# Patient Record
Sex: Female | Born: 1955
Health system: Southern US, Community
[De-identification: ages and names within clinical notes are randomized; demographics above are authoritative.]

## PROBLEM LIST (undated history)

## (undated) DIAGNOSIS — Z86010 Personal history of colonic polyps: Secondary | ICD-10-CM

## (undated) DIAGNOSIS — Z800 Family history of malignant neoplasm of digestive organs: Secondary | ICD-10-CM

## (undated) DIAGNOSIS — E785 Hyperlipidemia, unspecified: Secondary | ICD-10-CM

## (undated) DIAGNOSIS — Z8542 Personal history of malignant neoplasm of other parts of uterus: Secondary | ICD-10-CM

## (undated) DIAGNOSIS — Z85528 Personal history of other malignant neoplasm of kidney: Secondary | ICD-10-CM

## (undated) DIAGNOSIS — Z8042 Family history of malignant neoplasm of prostate: Secondary | ICD-10-CM

## (undated) DIAGNOSIS — K219 Gastro-esophageal reflux disease without esophagitis: Secondary | ICD-10-CM

## (undated) DIAGNOSIS — I4891 Unspecified atrial fibrillation: Secondary | ICD-10-CM

## (undated) DIAGNOSIS — C801 Malignant (primary) neoplasm, unspecified: Secondary | ICD-10-CM

## (undated) DIAGNOSIS — I10 Essential (primary) hypertension: Secondary | ICD-10-CM

## (undated) DIAGNOSIS — Z905 Acquired absence of kidney: Secondary | ICD-10-CM

## (undated) DIAGNOSIS — G4730 Sleep apnea, unspecified: Secondary | ICD-10-CM

## (undated) DIAGNOSIS — C649 Malignant neoplasm of unspecified kidney, except renal pelvis: Secondary | ICD-10-CM

## (undated) DIAGNOSIS — Z808 Family history of malignant neoplasm of other organs or systems: Secondary | ICD-10-CM

## (undated) DIAGNOSIS — Z9884 Bariatric surgery status: Secondary | ICD-10-CM

## (undated) DIAGNOSIS — G43909 Migraine, unspecified, not intractable, without status migrainosus: Secondary | ICD-10-CM

## (undated) DIAGNOSIS — M1990 Unspecified osteoarthritis, unspecified site: Secondary | ICD-10-CM

## (undated) DIAGNOSIS — G4733 Obstructive sleep apnea (adult) (pediatric): Secondary | ICD-10-CM

## (undated) DIAGNOSIS — C55 Malignant neoplasm of uterus, part unspecified: Secondary | ICD-10-CM

## (undated) HISTORY — PX: FRACTURE SURGERY: SHX138

## (undated) HISTORY — DX: Gastro-esophageal reflux disease without esophagitis: K21.9

## (undated) HISTORY — DX: Morbid (severe) obesity due to excess calories: E66.01

## (undated) HISTORY — DX: Personal history of other malignant neoplasm of kidney: Z85.528

## (undated) HISTORY — DX: Family history of malignant neoplasm of prostate: Z80.42

## (undated) HISTORY — PX: CHOLECYSTECTOMY: SHX55

## (undated) HISTORY — DX: Personal history of malignant neoplasm of other parts of uterus: Z85.42

## (undated) HISTORY — DX: Migraine, unspecified, not intractable, without status migrainosus: G43.909

## (undated) HISTORY — DX: Unspecified osteoarthritis, unspecified site: M19.90

## (undated) HISTORY — PX: BUNIONECTOMY: SHX129

## (undated) HISTORY — PX: REPLACEMENT TOTAL KNEE: SUR1224

## (undated) HISTORY — PX: ABDOMINAL HYSTERECTOMY: SHX81

## (undated) HISTORY — DX: Acquired absence of kidney: Z90.5

## (undated) HISTORY — PX: COLONOSCOPY: SHX174

## (undated) HISTORY — DX: Unspecified atrial fibrillation: I48.91

## (undated) HISTORY — DX: Family history of malignant neoplasm of digestive organs: Z80.0

## (undated) HISTORY — DX: Family history of malignant neoplasm of other organs or systems: Z80.8

## (undated) HISTORY — DX: Obstructive sleep apnea (adult) (pediatric): G47.33

## (undated) HISTORY — DX: Hyperlipidemia, unspecified: E78.5

---
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
## 2011-10-01 DIAGNOSIS — Z860101 Personal history of adenomatous and serrated colon polyps: Secondary | ICD-10-CM

## 2011-10-01 DIAGNOSIS — Z86010 Personal history of colonic polyps: Secondary | ICD-10-CM

## 2011-10-01 HISTORY — DX: Personal history of adenomatous and serrated colon polyps: Z86.0101

## 2011-10-01 HISTORY — DX: Personal history of colonic polyps: Z86.010

## 2012-09-30 DIAGNOSIS — C55 Malignant neoplasm of uterus, part unspecified: Secondary | ICD-10-CM

## 2012-09-30 HISTORY — DX: Malignant neoplasm of uterus, part unspecified: C55

## 2016-09-30 DIAGNOSIS — C649 Malignant neoplasm of unspecified kidney, except renal pelvis: Secondary | ICD-10-CM

## 2016-09-30 HISTORY — PX: NEPHRECTOMY: SHX65

## 2016-09-30 HISTORY — DX: Malignant neoplasm of unspecified kidney, except renal pelvis: C64.9

## 2016-11-13 HISTORY — PX: KIDNEY CYST REMOVAL: SHX684

## 2016-11-13 HISTORY — PX: GALLBLADDER SURGERY: SHX652

## 2018-05-22 DIAGNOSIS — I4891 Unspecified atrial fibrillation: Secondary | ICD-10-CM | POA: Diagnosis not present

## 2018-05-22 DIAGNOSIS — E785 Hyperlipidemia, unspecified: Secondary | ICD-10-CM | POA: Diagnosis not present

## 2018-05-22 DIAGNOSIS — Z1159 Encounter for screening for other viral diseases: Secondary | ICD-10-CM | POA: Diagnosis not present

## 2018-05-22 DIAGNOSIS — Z85528 Personal history of other malignant neoplasm of kidney: Secondary | ICD-10-CM | POA: Diagnosis not present

## 2018-05-22 DIAGNOSIS — I10 Essential (primary) hypertension: Secondary | ICD-10-CM | POA: Diagnosis not present

## 2018-05-22 DIAGNOSIS — Z0001 Encounter for general adult medical examination with abnormal findings: Secondary | ICD-10-CM | POA: Diagnosis not present

## 2018-06-04 DIAGNOSIS — C641 Malignant neoplasm of right kidney, except renal pelvis: Secondary | ICD-10-CM | POA: Diagnosis not present

## 2018-06-11 ENCOUNTER — Other Ambulatory Visit: Payer: Self-pay | Admitting: Urology

## 2018-06-11 DIAGNOSIS — C641 Malignant neoplasm of right kidney, except renal pelvis: Secondary | ICD-10-CM

## 2018-06-17 ENCOUNTER — Ambulatory Visit (HOSPITAL_COMMUNITY): Admission: RE | Admit: 2018-06-17 | Payer: BLUE CROSS/BLUE SHIELD | Source: Ambulatory Visit

## 2018-07-17 DIAGNOSIS — Z23 Encounter for immunization: Secondary | ICD-10-CM | POA: Diagnosis not present

## 2018-08-12 ENCOUNTER — Other Ambulatory Visit: Payer: Self-pay | Admitting: Family Medicine

## 2018-08-12 DIAGNOSIS — Z1231 Encounter for screening mammogram for malignant neoplasm of breast: Secondary | ICD-10-CM

## 2018-08-26 DIAGNOSIS — M170 Bilateral primary osteoarthritis of knee: Secondary | ICD-10-CM | POA: Diagnosis not present

## 2018-08-26 DIAGNOSIS — G4733 Obstructive sleep apnea (adult) (pediatric): Secondary | ICD-10-CM | POA: Diagnosis not present

## 2018-08-26 DIAGNOSIS — I10 Essential (primary) hypertension: Secondary | ICD-10-CM | POA: Diagnosis not present

## 2018-09-25 ENCOUNTER — Ambulatory Visit: Payer: BLUE CROSS/BLUE SHIELD

## 2018-10-02 ENCOUNTER — Ambulatory Visit
Admission: RE | Admit: 2018-10-02 | Discharge: 2018-10-02 | Disposition: A | Discharge status: Home / Self Care | Payer: BLUE CROSS/BLUE SHIELD | Source: Ambulatory Visit | Attending: Family Medicine | Admitting: Family Medicine

## 2018-10-02 ENCOUNTER — Encounter: Payer: Self-pay | Admitting: Radiology

## 2018-10-02 DIAGNOSIS — Z1231 Encounter for screening mammogram for malignant neoplasm of breast: Secondary | ICD-10-CM | POA: Diagnosis not present

## 2018-10-05 ENCOUNTER — Ambulatory Visit (HOSPITAL_COMMUNITY)
Admission: RE | Admit: 2018-10-05 | Discharge: 2018-10-05 | Disposition: A | Discharge status: Home / Self Care | Payer: BLUE CROSS/BLUE SHIELD | Source: Ambulatory Visit | Attending: Urology | Admitting: Urology

## 2018-10-05 ENCOUNTER — Encounter (HOSPITAL_COMMUNITY): Payer: Self-pay | Admitting: Radiology

## 2018-10-05 DIAGNOSIS — K5730 Diverticulosis of large intestine without perforation or abscess without bleeding: Secondary | ICD-10-CM | POA: Diagnosis not present

## 2018-10-05 DIAGNOSIS — C641 Malignant neoplasm of right kidney, except renal pelvis: Secondary | ICD-10-CM | POA: Diagnosis not present

## 2018-10-05 DIAGNOSIS — K449 Diaphragmatic hernia without obstruction or gangrene: Secondary | ICD-10-CM | POA: Diagnosis not present

## 2018-10-05 LAB — POCT I-STAT CREATININE: Creatinine, Ser: 1.3 mg/dL — ABNORMAL HIGH (ref 0.44–1.00)

## 2018-10-05 MED ORDER — SODIUM CHLORIDE (PF) 0.9 % IJ SOLN
INTRAMUSCULAR | Status: AC
  Filled 2018-10-05: qty 50

## 2018-10-05 MED ORDER — IOHEXOL 300 MG/ML  SOLN
75.0000 mL | Freq: Once | INTRAMUSCULAR | Status: AC | PRN
  Administered 2018-10-05: 75 mL via INTRAVENOUS

## 2018-10-14 DIAGNOSIS — Z6841 Body Mass Index (BMI) 40.0 and over, adult: Secondary | ICD-10-CM | POA: Diagnosis not present

## 2018-10-26 ENCOUNTER — Other Ambulatory Visit (HOSPITAL_COMMUNITY): Payer: Self-pay | Admitting: General Surgery

## 2018-11-03 ENCOUNTER — Other Ambulatory Visit: Payer: Self-pay | Admitting: General Surgery

## 2018-11-24 ENCOUNTER — Encounter: Payer: BLUE CROSS/BLUE SHIELD | Attending: General Surgery | Admitting: Skilled Nursing Facility1

## 2018-11-24 DIAGNOSIS — E669 Obesity, unspecified: Secondary | ICD-10-CM

## 2018-11-24 NOTE — Patient Instructions (Addendum)
With lunch do not eat chips, have carrots instead  Use whole wheat bread for your sandwich  Measure out the serving size of miracle whip serving  No sweet tea   No more frappe or Coffee at all  Do chair exercises every day at least once   Any sugar free drink is great!

## 2018-11-24 NOTE — Progress Notes (Signed)
Pre-Op Assessment Visit:  Pre-Operative RYGB Surgery  Medical Nutrition Therapy:  Appt start time: 7:54  End time:  9:00  Patient was seen on 11/24/2018 for Pre-Operative Nutrition Assessment. Assessment and letter of approval faxed to [facility] Bariatric Surgery Program coordinator on 11/24/2018.   Pt states numbers work really well for her. Pt states her husband has been through numerous health issues that she gained weight at each event. Pt states she really liked water aerobics. Pt states she wears her C-PAP every night. Pt state she wants baby steps but her husband is ready for her to make significant changes now but was very supportive to her needs.   According to pts referral: Pts surgeon wants her to be under BMI 65 which would calculate out to about 49 pounds lost.   Pt expectation of surgery: to lose weight  Pt expectation of Dietitian: to help her make healthy meals   Start weight at [facility]: 419.4 BMI: 71.99  24 hr Dietary Recall: First Meal: frozen fritata (cheese, spinach and ham) with frappe Snack 10am: coffee with sugar and creamer and peanut butter crackers Second Meal 1-2: white wheat bread with mayo and lunch meat and pepper and pickle and bag of chips and cup of pineapple Snack: cookies Third Meal: loaded baked potato and french onion soup with hamburger bun and cheese or sauer kraut with kilbasa  Snack: dessert Beverages: sweet tea, diet soda, water, coffee, frappe   Encouraged to engage in 150 minutes of moderate physical activity including cardiovascular and weight baring weekly  Handouts given during visit include:  . Pre-Op Goals . Bariatric Surgery Protein Shakes During the appointment today the following Pre-Op Goals were reviewed with the patient: . Maintain or lose weight as instructed by your surgeon . Make healthy food choices . Begin to limit portion sizes . Limited concentrated sugars and fried foods . Keep fat/sugar in the single  digits per serving on             food labels . Practice CHEWING your food  (aim for 30 chews per bite or until applesauce consistency) . Practice not drinking 15 minutes before, during, and 30 minutes after each meal/snack . Avoid all carbonated beverages  . Avoid/limit caffeinated beverages  . Avoid all sugar-sweetened beverages . Consume 3 meals per day; eat every 3-5 hours . Make a list of non-food related activities . Aim for 64-100 ounces of FLUID daily  . Aim for at least 60-80 grams of PROTEIN daily . Look for a liquid protein source that contain ?15 g protein and ?5 g carbohydrate  (ex: shakes, drinks, shots)  Goals: With lunch do not eat chips, have carrots instead Use whole wheat bread for your sandwich Measure out the serving size of miracle whip serving No sweet tea  No more frappe or Coffee at all Do chair exercises every day at least once  Any sugar free drink is great!   -Follow diet recommendations listed below   Energy and Macronutrient Recomendations: Calories: 1600 Carbohydrate: 180 Protein: 120 Fat: 44  Demonstrated degree of understanding via:  Teach Back  Teaching Method Utilized:  Visual Auditory Hands on  Barriers to learning/adherence to lifestyle change: none identified   Patient to call the [facility] to enroll in Pre-Op and Post-Op Nutrition Education when surgery date is scheduled.

## 2018-12-17 ENCOUNTER — Encounter: Payer: BLUE CROSS/BLUE SHIELD | Attending: General Surgery | Admitting: Skilled Nursing Facility1

## 2018-12-17 ENCOUNTER — Other Ambulatory Visit: Payer: Self-pay

## 2018-12-17 DIAGNOSIS — E669 Obesity, unspecified: Secondary | ICD-10-CM | POA: Diagnosis not present

## 2018-12-17 NOTE — Progress Notes (Signed)
RYGB  Assessment:  1 st SWL Appointment.   Pts husband is hard of hearing. Pt arrived having met all of her goals from previous visit.  Pt arrives having lost 7 pounds. Pt states she does her chair exercises at work and just needs to remember to do them at home. Pts husband states he has been on his wife to keep making these changes. Pt and her husband are working on how to support one another in healthy ways throughout this process.  According to pts referral: Pts surgeon wants her to be under BMI 65 which would calculate out to about 49 pounds lost.   Goals: -Do chair exercises every day -Create some new breakfast ideas: protein + energy source  -Try soy crumbles ([employer]) -Chew until applesauce consistency  -Do not drink 15 minutes before eating and wait 30 minutes after your meal before you drink again   Start weight at [facility]: 419.4 Weight: 412 BMI: 70.72   MEDICATIONS: see List   DIETARY INTAKE:  24-hr recall: munching on almonds at work throughout B ( AM): cup of ice water and 2 fratata frozen from [place] ( AM): naked crackers with peanut butter with ice water L ( PM): [place] sandwich on wheat with carrots with 1 pickle with pineapple tidbits (drinking the juice) Snk ( PM): nuts  D ( PM): 1 stuffed breaded chicken with broccoli with less butter than before Snk ( PM): cottage cheese and peaches  Beverages: water, diet coke  Usual physical activity: chair exercises   -Follow diet recommendations listed below   Energy and Macronutrient Recomendations: Calories: 1600 Carbohydrate: 180 Protein: 120 Fat: 44  Nutritional Diagnosis:  [place]-3.3 Overweight/obesity related to past poor dietary habits and physical inactivity as evidenced by patient w/ planned RYGB surgery following dietary guidelines for continued weight loss.    Intervention:  Nutrition counseling for upcoming Bariatric Surgery. Goals: -Encouraged to engage in 150 minutes of moderate  physical activity including cardiovascular and weight baring weekly  Teaching Method Utilized:  Visual Auditory Hands on  Barriers to learning/adherence to lifestyle change: none identified   Demonstrated degree of understanding via:  Teach Back   Monitoring/Evaluation:  Dietary intake, exercise, and body weight prn.

## 2018-12-17 NOTE — Patient Instructions (Addendum)
-  Do chair exercises every day  -Create some new breakfast ideas: protein + energy source   -Try soy crumbles ([employer])  -Chew until applesauce consistency   -Do not drink 15 minutes before eating and wait 30 minutes after your meal before you drink again

## 2018-12-22 DIAGNOSIS — R599 Enlarged lymph nodes, unspecified: Secondary | ICD-10-CM | POA: Diagnosis not present

## 2018-12-22 DIAGNOSIS — C641 Malignant neoplasm of right kidney, except renal pelvis: Secondary | ICD-10-CM | POA: Diagnosis not present

## 2018-12-23 ENCOUNTER — Other Ambulatory Visit: Payer: Self-pay | Admitting: Urology

## 2018-12-23 DIAGNOSIS — C641 Malignant neoplasm of right kidney, except renal pelvis: Secondary | ICD-10-CM

## 2019-01-19 ENCOUNTER — Other Ambulatory Visit: Payer: Self-pay

## 2019-01-19 ENCOUNTER — Ambulatory Visit: Payer: BLUE CROSS/BLUE SHIELD | Admitting: Psychiatry

## 2019-01-19 ENCOUNTER — Encounter: Payer: BLUE CROSS/BLUE SHIELD | Attending: General Surgery | Admitting: Dietician

## 2019-01-19 VITALS — Wt >= 6400 oz

## 2019-01-19 DIAGNOSIS — E669 Obesity, unspecified: Secondary | ICD-10-CM | POA: Insufficient documentation

## 2019-01-19 NOTE — Patient Instructions (Addendum)
-   Make sure you do chair exercises at least once per day. If you can and have time, start adding in another chair exercise session!  - Continue working on not drinking with meals. Try leaving your drink out of sight during meals.   - Slow down when eating by chewing each bite very thoroughly and taking small bites at a time. Try eating with your non-dominant hand and eating off of smaller plates/bowls.

## 2019-01-19 NOTE — Progress Notes (Signed)
Bariatric Supervised Weight Loss Visit Appt Start Time: 7:30am  End Time: 8:00am  Planned Surgery: RYGB    2nd SWL Appointment   Per referral: surgeon prefers BMI <65 (equates to 370 lbs, or about a 49 lb loss from start weight)  NUTRITION ASSESSMENT  Anthropometrics  Start weight at [facility]: 419.4 lbs (date: 11/24/2018) Today's weight: 408.4 lbs Weight change: -3.6 lbs (since previous visit on 12/17/2018) BMI: 70.1 kg/m2    Clinical  Medical Hx: obesity, uterine cancer, kidney cancer, GERD, migraines, sleep apnea Medications: Lisinopril, metoprolol, omeprazole, pravastatin  Psychosocial/Lifestyle Works as Market researcher for a company. Lives with her husband and has about 5 grandkids. Husband is HOH. Husband performs the food shopping, and they are both working on how to support each other through this process. Pt is very talkative.   24-Hr Dietary Recall First Meal: waffles/pancakes (or spinach, bacon, & cheese frittata) (or quiche) (or scrambled eggs with [employer] soy crumbles)  Snack: none stated  Second Meal: mac & cheese + [place] bacon  Snack: none stated  Third Meal: pot roast + potatoes + carrots  Snack: none stated  Beverages: water, diet soda  Food & Nutrition Related Hx Dietary Hx: Likes to have grilled chicken or steak. Working on chewing more thoroughly. States her husband often brings home desserts and "treats" for her, such as pies and candy bars. Recently, pt has asked her husband to get [place] for her dessert instead. Other liked foods include crackers with peanut butter, [place] sandwiches, cottage cheese, and fruit. Pt states she tried the soy crumbles in her eggs and liked them, and looks forward to using them in her breakfast frittatas.  Estimated Daily Fluid Intake: 64 oz Supplements: vitamin C and D GI / Other Notable Symptoms: none stated   Physical Activity  Current average weekly physical activity: chair exercises most days of the  week, will be doing swimming this summer (currently working from home during the coronavirus pandemic)  Estimated Energy Needs Calories: 1600 Carbohydrate: 180g Protein: 120g Fat: 44g   NUTRITION DIAGNOSIS  Overweight/obesity ([place]-3.3) related to past poor dietary habits and physical inactivity as evidenced by patient w/ planned RYGB surgery following dietary guidelines for continued weight loss.   NUTRITION INTERVENTION  Nutrition counseling (C-1) and education (E-2) to facilitate bariatric surgery goals.  Pre-Op Goals Progress & New Goals . Working on chair exercises, remembers to do them most days of the week . Working on not drinking with meals  . Making better food choices . NEW: chew food thoroughly to applesauce consistency    Learning Style & Readiness for Change Teaching method utilized: Visual & Auditory  Demonstrated degree of understanding via: Teach Back  Barriers to learning/adherence to lifestyle change: None Identified    MONITORING & EVALUATION Dietary intake, weekly physical activity, body weight, and pre-op goals in 1 month.   Next Steps  Patient is to return to [facility] in 1 month for 3rd SWL visit.

## 2019-02-01 ENCOUNTER — Encounter (HOSPITAL_COMMUNITY): Payer: Self-pay

## 2019-02-01 ENCOUNTER — Ambulatory Visit (HOSPITAL_COMMUNITY): Payer: BLUE CROSS/BLUE SHIELD

## 2019-02-01 ENCOUNTER — Other Ambulatory Visit: Payer: Self-pay

## 2019-02-01 ENCOUNTER — Ambulatory Visit (HOSPITAL_COMMUNITY): Admission: RE | Admit: 2019-02-01 | Payer: BLUE CROSS/BLUE SHIELD | Source: Ambulatory Visit

## 2019-02-01 ENCOUNTER — Ambulatory Visit (HOSPITAL_COMMUNITY)
Admission: RE | Admit: 2019-02-01 | Discharge: 2019-02-01 | Disposition: A | Discharge status: Home / Self Care | Payer: BLUE CROSS/BLUE SHIELD | Source: Ambulatory Visit | Attending: Urology | Admitting: Urology

## 2019-02-01 DIAGNOSIS — C641 Malignant neoplasm of right kidney, except renal pelvis: Secondary | ICD-10-CM | POA: Insufficient documentation

## 2019-02-01 HISTORY — DX: Malignant (primary) neoplasm, unspecified: C80.1

## 2019-02-01 HISTORY — DX: Malignant neoplasm of uterus, part unspecified: C55

## 2019-02-01 HISTORY — DX: Essential (primary) hypertension: I10

## 2019-02-01 LAB — POCT I-STAT CREATININE: Creatinine, Ser: 1.2 mg/dL — ABNORMAL HIGH (ref 0.44–1.00)

## 2019-02-01 MED ORDER — SODIUM CHLORIDE (PF) 0.9 % IJ SOLN
INTRAMUSCULAR | Status: AC
  Filled 2019-02-01: qty 50

## 2019-02-01 MED ORDER — IOHEXOL 300 MG/ML  SOLN
100.0000 mL | Freq: Once | INTRAMUSCULAR | Status: AC | PRN
  Administered 2019-02-01: 100 mL via INTRAVENOUS

## 2019-02-04 DIAGNOSIS — R599 Enlarged lymph nodes, unspecified: Secondary | ICD-10-CM | POA: Diagnosis not present

## 2019-02-04 DIAGNOSIS — C641 Malignant neoplasm of right kidney, except renal pelvis: Secondary | ICD-10-CM | POA: Diagnosis not present

## 2019-02-17 ENCOUNTER — Encounter: Payer: Self-pay | Admitting: Dietician

## 2019-02-17 ENCOUNTER — Other Ambulatory Visit: Payer: Self-pay

## 2019-02-17 ENCOUNTER — Encounter: Payer: BLUE CROSS/BLUE SHIELD | Attending: General Surgery | Admitting: Dietician

## 2019-02-17 VITALS — Wt >= 6400 oz

## 2019-02-17 DIAGNOSIS — E669 Obesity, unspecified: Secondary | ICD-10-CM | POA: Diagnosis not present

## 2019-02-17 NOTE — Patient Instructions (Addendum)
   Eat at least 1-2 servings of fruit and at least 2-3 servings of non-starchy vegetables every day.   Non-starchy vegetables include: asparagus, carrots, beets, spinach, green beans, collard greens, cabbage, bell peppers, tomatoes, etc.  Non-starchy vegetables are ANY vegetable EXCEPT potatoes, corn, and peas.   Continue to do your chair exercises every day and move as much as you can.   Remember: start looking at the Nutrition Facts label for the grams of Fat and Sugar. Keep both of these as a single digit (9 grams or less.) Avoid foods with added sugar and lots of fat, such as butter, full-fat cheese, bacon, fried foods, and greasy foods.

## 2019-02-17 NOTE — Progress Notes (Signed)
Bariatric Supervised Weight Loss Visit Appt Start Time: 7:30am  End Time: 8:00am  Planned Surgery: RYGB    3rd SWL Appointment   Per referral: surgeon prefers BMI <65 (equates to 370 lbs, or about a 49 lb loss from start weight)  NUTRITION ASSESSMENT  Anthropometrics  Start weight at [facility]: 419.4 lbs (date: 11/24/2018) Today's weight: 408.4 lbs Weight change: +1.1 lbs (since previous visit on 01/19/2019) BMI: 70.3 kg/m2    Clinical  Medical Hx: obesity, uterine cancer, kidney cancer, GERD, migraines, sleep apnea Medications: Lisinopril, metoprolol, omeprazole, pravastatin  Psychosocial/Lifestyle Works as Market researcher for a company. Lives with her husband and has about 5 grandkids. Husband is HOH. Husband performs the food shopping, and they are both working on how to support each other through this process. Pt is very talkative.   24-Hr Dietary Recall First Meal: waffles/pancakes (or spinach, bacon, & cheese frittata) (or quiche) (or scrambled eggs with [employer] soy crumbles)  Snack: peanut butter crackers  Second Meal: [place] sandwich + pickle + chips   Snack: none stated  Third Meal: pot roast + potatoes + carrots  Snack: Rice Krispy Treat  Beverages: water, diet soda, wine 1x/week   Food & Nutrition Related Hx Dietary Hx: Likes to have grilled chicken or steak. Working on chewing more thoroughly. States her husband often brings home desserts and "treats" for her, such as pies and candy bars. Recently, pt has asked her husband to get [place] for her dessert instead. Other liked foods include crackers with peanut butter, [place] sandwiches, cottage cheese, and fruit. Pt states she tried the soy crumbles in her eggs and liked them, and looks forward to using them in her breakfast frittatas.   Has had more company over throughout the past month, so has had more chips and popcorn around rather than baby carrots to snack on. Husband still buying snacks and  desserts for her, which she states is frustrating.   Estimated Daily Fluid Intake: 64 oz Supplements: vitamin C and D GI / Other Notable Symptoms: none stated   Physical Activity  Current average weekly physical activity: chair exercises most days of the week, will be doing more swimming this summer (currently working from home during the coronavirus pandemic). Has knee pain, cannot stand or walk much.   Estimated Energy Needs Calories: 1600 Carbohydrate: 180g Protein: 120g Fat: 44g   NUTRITION DIAGNOSIS  Overweight/obesity ([place]-3.3) related to past poor dietary habits and physical inactivity as evidenced by patient w/ planned RYGB surgery following dietary guidelines for continued weight loss.   NUTRITION INTERVENTION  Nutrition counseling (C-1) and education (E-2) to facilitate bariatric surgery goals.  Pre-Op Goals Progress & New Goals . Working on chair exercises, remembers to do them most days of the week . Working on not drinking with meals  . Making better food choices . Chewing food thoroughly to applesauce consistency  . NEW: Avoid concentrated sugars and fried foods; keep grams of fat and sugar in the single digits per serving on food labels.  . NEW: Eat at least 1-2 servings of fruit and at least 2-3 servings of non-starchy vegetables every day.  Learning Style & Readiness for Change Teaching method utilized: Visual & Auditory  Demonstrated degree of understanding via: Teach Back  Barriers to learning/adherence to lifestyle change: None Identified    MONITORING & EVALUATION Dietary intake, weekly physical activity, body weight, and pre-op goals.   Next Steps  Patient is to call [facility] to schedule Pre and Post-Op  Classes once surgery date is scheduled. Patient has completed 3 SWL Visits, I am not sure her insurance company's requirement.

## 2019-02-18 ENCOUNTER — Ambulatory Visit: Payer: BLUE CROSS/BLUE SHIELD | Admitting: Psychiatry

## 2019-02-24 ENCOUNTER — Ambulatory Visit (INDEPENDENT_AMBULATORY_CARE_PROVIDER_SITE_OTHER): Payer: BLUE CROSS/BLUE SHIELD | Admitting: Psychology

## 2019-02-24 DIAGNOSIS — F509 Eating disorder, unspecified: Secondary | ICD-10-CM | POA: Diagnosis not present

## 2019-03-22 ENCOUNTER — Ambulatory Visit: Payer: BLUE CROSS/BLUE SHIELD | Admitting: Skilled Nursing Facility1

## 2019-03-23 ENCOUNTER — Encounter: Payer: BC Managed Care – PPO | Attending: General Surgery | Admitting: Skilled Nursing Facility1

## 2019-03-23 ENCOUNTER — Other Ambulatory Visit: Payer: Self-pay

## 2019-03-23 DIAGNOSIS — E669 Obesity, unspecified: Secondary | ICD-10-CM

## 2019-03-24 NOTE — Progress Notes (Signed)
Pre-Operative Nutrition Class:  Appt start time: 1730   End time:  1830.  Patient was seen on 03/23/2019 for Pre-Operative Bariatric Surgery Education [facility].   Surgery date:  Surgery type: RYGB Start weight at [facility]: 419.4 Weight today: 410.7  Samples given per MNT protocol. Patient educated on appropriate usage: Bariatric Advantage Multivitamin Lot # [id number] Exp: 04/21  Bariatric Advantage Calcium  Lot # [id number] Exp: 01/09/20   Protein20 Lot # [id number] Exp: 02/16/20  The following the learning objectives were met by the patient during this course:  Identify Pre-Op Dietary Goals and will begin 2 weeks pre-operatively  Identify appropriate sources of fluids and proteins   State protein recommendations and appropriate sources pre and post-operatively  Identify Post-Operative Dietary Goals and will follow for 2 weeks post-operatively  Identify appropriate multivitamin and calcium sources  Describe the need for physical activity post-operatively and will follow MD recommendations  State when to call healthcare provider regarding medication questions or post-operative complications  Handouts given during class include:  Pre-Op Bariatric Surgery Diet Handout  Protein Shake Handout  Post-Op Bariatric Surgery Nutrition Handout  [facility] Program Information Flyer  Support Group Information Flyer  [facility] Bariatric Supplements Price List  Follow-Up Plan: Patient will follow-up at [facility] 2 weeks post operatively for diet advancement per MD.   

## 2019-03-30 ENCOUNTER — Other Ambulatory Visit: Payer: Self-pay | Admitting: Urology

## 2019-03-30 DIAGNOSIS — C641 Malignant neoplasm of right kidney, except renal pelvis: Secondary | ICD-10-CM

## 2019-04-06 DIAGNOSIS — K219 Gastro-esophageal reflux disease without esophagitis: Secondary | ICD-10-CM | POA: Diagnosis not present

## 2019-04-06 DIAGNOSIS — E785 Hyperlipidemia, unspecified: Secondary | ICD-10-CM | POA: Diagnosis not present

## 2019-04-06 DIAGNOSIS — I10 Essential (primary) hypertension: Secondary | ICD-10-CM | POA: Diagnosis not present

## 2019-04-06 DIAGNOSIS — G4733 Obstructive sleep apnea (adult) (pediatric): Secondary | ICD-10-CM | POA: Diagnosis not present

## 2019-04-14 ENCOUNTER — Other Ambulatory Visit (HOSPITAL_COMMUNITY): Payer: Self-pay | Admitting: *Deleted

## 2019-04-14 ENCOUNTER — Encounter: Payer: Self-pay | Admitting: Surgery

## 2019-04-14 NOTE — Progress Notes (Signed)
CHEST CT WITH CONTRAST 02-01-19 EPIC

## 2019-04-14 NOTE — Patient Instructions (Signed)
YOU NEED TO HAVE A COVID 19 TEST ON 04-15-2019 AT 1115 PM, THIS TEST MUST BE DONE BEFORE SURGERY, COME TO [place] ENTRANCE. ONCE YOUR COVID TEST IS COMPLETED, PLEASE BEGIN THE QUARANTINE INSTRUCTIONS AS OUTLINED IN YOUR HANDOUT.                Kelsey Henson     Your procedure is scheduled on: 04-19-2019  Report to [facility] Main  Entrance  Report to [place] [facility] at 530  AM      Call this number if you have problems the morning of surgery [phone]    Remember: Do not eat food or drink liquids :After Midnight. BRUSH YOUR TEETH MORNING OF SURGERY AND RINSE YOUR MOUTH OUT, NO CHEWING GUM CANDY OR MINTS.     Take these medicines the morning of surgery with A SIP OF WATER:  DO NOT TAKE ANY DIABETIC MEDICATIONS DAY OF YOUR SURGERY                               You may not have any metal on your body including hair pins and              piercings  Do not wear jewelry, make-up, lotions, powders or perfumes, deodorant             Do not wear nail polish.  Do not shave  48 hours prior to surgery.              Men may shave face and neck.   Do not bring valuables to the hospital. [place].  Contacts, dentures or bridgework may not be worn into surgery.  Leave suitcase in the car. After surgery it may be brought to your room.     Patients discharged the day of surgery will not be allowed to drive home. IF YOU ARE HAVING SURGERY AND GOING HOME THE SAME DAY, YOU MUST HAVE AN ADULT TO DRIVE YOU HOME AND BE WITH YOU FOR 24 HOURS. YOU MAY GO HOME BY TAXI OR UBER OR ORTHERWISE, BUT AN ADULT MUST ACCOMPANY YOU HOME AND STAY WITH YOU FOR 24 HOURS.  Name and phone number of your driver:  Special Instructions: N/A              Please read over the following fact sheets you were given: _____________________________________________________________________             [facility] - Preparing for  Surgery Before surgery, you can play an important role.  Because skin is not sterile, your skin needs to be as free of germs as possible.  You can reduce the number of germs on your skin by washing with CHG (chlorahexidine gluconate) soap before surgery.  CHG is an antiseptic cleaner which kills germs and bonds with the skin to continue killing germs even after washing. Please DO NOT use if you have an allergy to CHG or antibacterial soaps.  If your skin becomes reddened/irritated stop using the CHG and inform your nurse when you arrive at Short Stay. Do not shave (including legs and underarms) for at least 48 hours prior to the first CHG shower.  You may shave your face/neck. Please follow these instructions carefully:  1.  Shower with CHG Soap the night before surgery and the  morning  of Surgery.  2.  If you choose to wash your hair, wash your hair first as usual with your  normal  shampoo.  3.  After you shampoo, rinse your hair and body thoroughly to remove the  shampoo.                           4.  Use CHG as you would any other liquid soap.  You can apply chg directly  to the skin and wash                       Gently with a scrungie or clean washcloth.  5.  Apply the CHG Soap to your body ONLY FROM THE NECK DOWN.   Do not use on face/ open                           Wound or open sores. Avoid contact with eyes, ears mouth and genitals (private parts).                       Wash face,  Genitals (private parts) with your normal soap.             6.  Wash thoroughly, paying special attention to the area where your surgery  will be performed.  7.  Thoroughly rinse your body with warm water from the neck down.  8.  DO NOT shower/wash with your normal soap after using and rinsing off  the CHG Soap.                9.  Pat yourself dry with a clean towel.            10.  Wear clean pajamas.            11.  Place clean sheets on your bed the night of your first shower and do not  sleep with pets. Day  of Surgery : Do not apply any lotions/deodorants the morning of surgery.  Please wear clean clothes to the hospital/surgery center.  FAILURE TO FOLLOW THESE INSTRUCTIONS MAY RESULT IN THE CANCELLATION OF YOUR SURGERY PATIENT SIGNATURE_________________________________  NURSE SIGNATURE__________________________________  ________________________________________________________________________

## 2019-04-15 ENCOUNTER — Ambulatory Visit (INDEPENDENT_AMBULATORY_CARE_PROVIDER_SITE_OTHER): Payer: BC Managed Care – PPO | Admitting: Psychology

## 2019-04-15 ENCOUNTER — Inpatient Hospital Stay (HOSPITAL_COMMUNITY)
Admission: RE | Admit: 2019-04-15 | Discharge: 2019-04-15 | Disposition: A | Discharge status: Home / Self Care | Payer: BC Managed Care – PPO | Source: Ambulatory Visit

## 2019-04-15 ENCOUNTER — Other Ambulatory Visit (HOSPITAL_COMMUNITY): Payer: BC Managed Care – PPO

## 2019-04-15 DIAGNOSIS — F509 Eating disorder, unspecified: Secondary | ICD-10-CM | POA: Diagnosis not present

## 2019-04-15 DIAGNOSIS — I10 Essential (primary) hypertension: Secondary | ICD-10-CM | POA: Diagnosis not present

## 2019-04-15 DIAGNOSIS — Z9989 Dependence on other enabling machines and devices: Secondary | ICD-10-CM | POA: Diagnosis not present

## 2019-04-15 DIAGNOSIS — G4733 Obstructive sleep apnea (adult) (pediatric): Secondary | ICD-10-CM | POA: Diagnosis not present

## 2019-04-19 ENCOUNTER — Encounter (HOSPITAL_COMMUNITY): Admission: RE | Payer: Self-pay | Source: Home / Self Care

## 2019-04-19 ENCOUNTER — Inpatient Hospital Stay (HOSPITAL_COMMUNITY): Admission: RE | Admit: 2019-04-19 | Payer: BC Managed Care – PPO | Source: Home / Self Care | Admitting: Surgery

## 2019-04-19 SURGERY — LAPAROSCOPIC ROUX-EN-Y GASTRIC BYPASS WITH UPPER ENDOSCOPY
Anesthesia: General

## 2019-04-22 ENCOUNTER — Ambulatory Visit: Payer: Self-pay | Admitting: Surgery

## 2019-04-22 NOTE — Progress Notes (Signed)
LABS DONE BY [facility] 04-15-2019 ON CHART: CBC WITH DIF CMET HCG, FOLIC ACID HEMAGLOBIN K3K IRON PT LIPID PANEL UA VIT B12 VIT D 1, 2, AND 3 T4

## 2019-04-22 NOTE — Patient Instructions (Signed)
YOU NEED TO HAVE A COVID 19 TEST ON 04-23-2019.  THIS TEST MUST BE DONE BEFORE SURGERY, COME TO [place] ENTRANCE. ONCE YOUR COVID TEST IS COMPLETED, PLEASE BEGIN THE QUARANTINE INSTRUCTIONS AS OUTLINED IN YOUR HANDOUT.                Kelsey Henson    Your procedure is scheduled on: 04-27-2019  Report to [facility] Main  Entrance  Report to admitting at 845 AM   [address] [place].    Call this number if you have problems the morning of surgery [phone]    Remember: [place], NO CHEWING GUM CANDY OR MINTS.    MORNING OF SURGERY DRINK:   DRINK 1 G2 drink BEFORE YOU LEAVE HOME, DRINK ALL OF THE  G2 DRINK AT ONE TIME.   NO SOLID FOOD AFTER 600 PM THE NIGHT BEFORE YOUR SURGERY. YOU MAY DRINK CLEAR FLUIDS. THE G2 DRINK YOU DRINK BEFORE YOU LEAVE HOME WILL BE THE LAST FLUIDS YOU DRINK BEFORE SURGERY. DRINK G2 DRINK AT 745 AM.   PAIN IS EXPECTED AFTER SURGERY AND WILL NOT BE COMPLETELY ELIMINATED. AMBULATION AND TYLENOL WILL HELP REDUCE INCISIONAL AND GAS PAIN. MOVEMENT IS KEY!  YOU ARE EXPECTED TO BE OUT OF BED WITHIN 4 HOURS OF ADMISSION TO YOUR PATIENT ROOM.  SITTING IN THE RECLINER THROUGHOUT THE DAY IS IMPORTANT FOR DRINKING FLUIDS AND MOVING GAS THROUGHOUT THE GI TRACT.  COMPRESSION STOCKINGS SHOULD BE WORN [place] UNLESS YOU ARE WALKING.   INCENTIVE SPIROMETER SHOULD BE USED EVERY HOUR WHILE AWAKE TO DECREASE POST-OPERATIVE COMPLICATIONS SUCH AS PNEUMONIA.  WHEN DISCHARGED HOME, IT IS IMPORTANT TO CONTINUE TO WALK EVERY HOUR AND USE THE INCENTIVE SPIROMETER EVERY HOUR.        Take these medicines the morning of surgery with A SIP OF WATER: METOPROLOL TARTRATE                                You may not have any metal on your body including hair pins and              piercings  Do not wear jewelry, make-up,  lotions, powders or perfumes, deodorant             Do not wear nail polish.  Do not shave  48 hours prior to surgery.              Do not bring valuables to the hospital. [place].  Contacts, dentures or bridgework may not be worn into surgery.  Leave suitcase in the car. After surgery it may be brought to your room.     _____________________________________________________________________             [facility] - Preparing for Surgery Before surgery, you can play an important role.  Because skin is not sterile, your skin needs to be as free of germs as possible.  You can reduce the number of germs on your skin by washing with CHG (chlorahexidine gluconate) soap before surgery.  CHG is an antiseptic cleaner which kills germs and bonds with the skin to continue killing germs even after washing. Please DO NOT use  if you have an allergy to CHG or antibacterial soaps.  If your skin becomes reddened/irritated stop using the CHG and inform your nurse when you arrive at Short Stay. Do not shave (including legs and underarms) for at least 48 hours prior to the first CHG shower.  You may shave your face/neck. Please follow these instructions carefully:  1.  Shower with CHG Soap the night before surgery and the  morning of Surgery.  2.  If you choose to wash your hair, wash your hair first as usual with your  normal  shampoo.  3.  After you shampoo, rinse your hair and body thoroughly to remove the  shampoo.                           4.  Use CHG as you would any other liquid soap.  You can apply chg directly  to the skin and wash                       Gently with a scrungie or clean washcloth.  5.  Apply the CHG Soap to your body ONLY FROM THE NECK DOWN.   Do not use on face/ open                           Wound or open sores. Avoid contact with eyes, ears mouth and genitals (private parts).                       Wash face,  Genitals (private parts)  with your normal soap.             6.  Wash thoroughly, paying special attention to the area where your surgery  will be performed.  7.  Thoroughly rinse your body with warm water from the neck down.  8.  DO NOT shower/wash with your normal soap after using and rinsing off  the CHG Soap.                9.  Pat yourself dry with a clean towel.            10.  Wear clean pajamas.            11.  Place clean sheets on your bed the night of your first shower and do not  sleep with pets. Day of Surgery : Do not apply any lotions/deodorants the morning of surgery.  Please wear clean clothes to the hospital/surgery center.  FAILURE TO FOLLOW THESE INSTRUCTIONS MAY RESULT IN THE CANCELLATION OF YOUR SURGERY PATIENT SIGNATURE_________________________________  NURSE SIGNATURE__________________________________  ________________________________________________________________________   Kelsey Henson  An incentive spirometer is a tool that can help keep your lungs clear and active. This tool measures how well you are filling your lungs with each breath. Taking long deep breaths may help reverse or decrease the chance of developing breathing (pulmonary) problems (especially infection) following:  A long period of time when you are unable to move or be active. BEFORE THE PROCEDURE   If the spirometer includes an indicator to show your best effort, your nurse or respiratory therapist will set it to a desired goal.  If possible, sit up straight or lean slightly forward. Try not to slouch.  Hold the incentive spirometer in an upright position. INSTRUCTIONS FOR USE  1. Sit on the edge of your bed if possible, or sit  up as far as you can in bed or on a chair. 2. Hold the incentive spirometer in an upright position. 3. Breathe out normally. 4. Place the mouthpiece in your mouth and seal your lips tightly around it. 5. Breathe in slowly and as deeply as possible, raising the piston or the ball  toward the top of the column. 6. Hold your breath for 3-5 seconds or for as long as possible. Allow the piston or ball to fall to the bottom of the column. 7. Remove the mouthpiece from your mouth and breathe out normally. 8. Rest for a few seconds and repeat Steps 1 through 7 at least 10 times every 1-2 hours when you are awake. Take your time and take a few normal breaths between deep breaths. 9. The spirometer may include an indicator to show your best effort. Use the indicator as a goal to work toward during each repetition. 10. After each set of 10 deep breaths, practice coughing to be sure your lungs are clear. If you have an incision (the cut made at the time of surgery), support your incision when coughing by placing a pillow or rolled up towels firmly against it. Once you are able to get out of bed, walk around indoors and cough well. You may stop using the incentive spirometer when instructed by your caregiver.  RISKS AND COMPLICATIONS  Take your time so you do not get dizzy or light-headed.  If you are in pain, you may need to take or ask for pain medication before doing incentive spirometry. It is harder to take a deep breath if you are having pain. AFTER USE  Rest and breathe slowly and easily.  It can be helpful to keep track of a log of your progress. Your caregiver can provide you with a simple table to help with this. If you are using the spirometer at home, follow these instructions: [place] IF:   You are having difficultly using the spirometer.  You have trouble using the spirometer as often as instructed.  Your pain medication is not giving enough relief while using the spirometer.  You develop fever of 100.5 F (38.1 C) or higher. SEEK IMMEDIATE MEDICAL CARE IF:   You cough up bloody sputum that had not been present before.  You develop fever of 102 F (38.9 C) or greater.  You develop worsening pain at or near the incision site. MAKE SURE YOU:    Understand these instructions.  Will watch your condition.  Will get help right away if you are not doing well or get worse. Document Released: 01/27/2007 Document Revised: 12/09/2011 Document Reviewed: 03/30/2007 ExitCare Patient Information 2014 Exit[employer], [place].   ________________________________________________________________________  WHAT IS A BLOOD TRANSFUSION? Blood Transfusion Information  A transfusion is the replacement of blood or some of its parts. Blood is made up of multiple cells which provide different functions.  Red blood cells carry oxygen and are used for blood loss replacement.  White blood cells fight against infection.  Platelets control bleeding.  Plasma helps clot blood.  Other blood products are available for specialized needs, such as hemophilia or other clotting disorders. BEFORE THE TRANSFUSION  Who gives blood for transfusions?   Healthy volunteers who are fully evaluated to make sure their blood is safe. This is blood bank blood. Transfusion therapy is the safest it has ever been in the practice of medicine. Before blood is taken from a donor, a complete history is taken to make sure that person has  no history of diseases nor engages in risky social behavior (examples are intravenous drug use or sexual activity with multiple partners). The donor's travel history is screened to minimize risk of transmitting infections, such as malaria. The donated blood is tested for signs of infectious diseases, such as HIV and hepatitis. The blood is then tested to be sure it is compatible with you in order to minimize the chance of a transfusion reaction. If you or a relative donates blood, this is often done in anticipation of surgery and is not appropriate for emergency situations. It takes many days to process the donated blood. RISKS AND COMPLICATIONS Although transfusion therapy is very safe and saves many lives, the main dangers of transfusion include:    Getting an infectious disease.  Developing a transfusion reaction. This is an allergic reaction to something in the blood you were given. Every precaution is taken to prevent this. The decision to have a blood transfusion has been considered carefully by your caregiver before blood is given. Blood is not given unless the benefits outweigh the risks. AFTER THE TRANSFUSION  Right after receiving a blood transfusion, you will usually feel much better and more energetic. This is especially true if your red blood cells have gotten low (anemic). The transfusion raises the level of the red blood cells which carry oxygen, and this usually causes an energy increase.  The nurse administering the transfusion will monitor you carefully for complications. HOME CARE INSTRUCTIONS  No special instructions are needed after a transfusion. You may find your energy is better. Speak with your caregiver about any limitations on activity for underlying diseases you may have. SEEK MEDICAL CARE IF:   Your condition is not improving after your transfusion.  You develop redness or irritation at the intravenous (IV) site. SEEK IMMEDIATE MEDICAL CARE IF:  Any of the following symptoms occur over the next 12 hours:  Shaking chills.  You have a temperature by mouth above 102 F (38.9 C), not controlled by medicine.  Chest, back, or muscle pain.  People around you feel you are not acting correctly or are confused.  Shortness of breath or difficulty breathing.  Dizziness and fainting.  You get a rash or develop hives.  You have a decrease in urine output.  Your urine turns a dark color or changes to pink, red, or brown. Any of the following symptoms occur over the next 10 days:  You have a temperature by mouth above 102 F (38.9 C), not controlled by medicine.  Shortness of breath.  Weakness after normal activity.  The white part of the eye turns yellow (jaundice).  You have a decrease in the  amount of urine or are urinating less often.  Your urine turns a dark color or changes to pink, red, or brown. Document Released: 09/13/2000 Document Revised: 12/09/2011 Document Reviewed: 05/02/2008 [facility] Patient Information 2014 [place], [place].  _______________________________________________________________________

## 2019-04-22 NOTE — H&P (Signed)
Surgical H&P  CC: severe obesity  HPI:  This is a 63 year old woman who was initially evaluated by Dr. Excell Seltzer in January of this year and initiated on the pathway for a laparoscopic Roux-en-Y gastric bypass. She has a history of morbid obesity unresponsive to multiple efforts at medical management. She is following up today for surgical management of her morbid obesity and to establish care with me.  She has nearly completed her bariatric workup but still needs to have labs and one more visit with a psychologist, which is scheduled for this afternoon. She did not have a chest x-ray or upper GI that I can see however she did have a CT chest in May which is sufficient from my standpoint for preoperative imaging. She has succeeded in reducing her BMI to just under 65. She has consistently followed up with the dietitians monthly from February through June. She reported a history of atrial fibrillation which was an isolated issue surrounding likely sepsis from a gangrenous gallbladder which was treated with a percutaneous cholecystostomy tube at [facility] [place] prior to her subsequent right nephrectomy and cholecystectomy. Subsequent Holter monitoring did not demonstrate any atrial fibrillation. This was felt to be reactive/provoked and no further cardiology follow-up was recommended by the cardiologist who evaluated her at the time.   Her comorbidities include obstructive sleep apnea on CPAP, osteoarthritis, GERD, hypertension and elevated cholesterol. She has a solitary left kidney and her baseline creatinine seems to be about 1.2-1.3.  Over the last 6 months, she has had no major changes in her health.   From Dr. Lear Ng note in January of this year : "The patient gives a history of progressive obesity since early adulthood despite multiple attempts at medical management. She has been able to lose a significant amount of weight in years past with diet and exercise. Actually lost 100 pounds with  Weight Watchers in the early 1990s and got down to a normal weight but then had experienced progressive weight regain. She has lost weight through other measures over the years but then regains this and additional weight. Her husband has had multiple medical issues, for renal transplant since she tends to eat nor her health when he gets sick. Obesity has been affecting the patient in a number of ways including increasing difficulty with routine daily activities and mobility. Significant co-morbid illnesses have developed including obstructive sleep apnea, hypertension, elevated cholesterol, bone-on-bone arthritis in her left knee and GERD. The patient has been to our initial information seminar, researched surgical options thoroughly, and is interested in gastric bypass due to her concern over worsening her reflux. Her reflux is easily managed on omeprazole. She has a significant history of uterine cancer with laparoscopic TAH/BSO in 2014 and then right renal mass status post right nephrectomy and cholecystectomy in 2018, all surgery in [place]. She is followed here by Dr. Gloriann Loan for urology. Staging CT just completed last week showed only a nonspecific mildly enlarged porta hepatis node. She is accompanied today by her husband.".  No Known Allergies  Past Medical History:  Diagnosis Date  . Hypertension   . right renal ca dx'd 10/2016   rt nephrectomy  . Uterine cancer ([place]) dx'd 2014   hysterectomy    No past surgical history on file.  No family history on file.  Social History   Socioeconomic History  . Marital status: Married    Spouse name: Not on file  . Number of children: Not on file  . Years of education: Not on file  .  Highest education level: Not on file  Occupational History  . Not on file  Social Needs  . Financial resource strain: Not on file  . Food insecurity    Worry: Not on file    Inability: Not on file  . Transportation needs    Medical: Not on file     Non-medical: Not on file  Tobacco Use  . Smoking status: Not on file  Substance and Sexual Activity  . Alcohol use: Not on file  . Drug use: Not on file  . Sexual activity: Not on file  Lifestyle  . Physical activity    Days per week: Not on file    Minutes per session: Not on file  . Stress: Not on file  Relationships  . Social Herbalist on phone: Not on file    Gets together: Not on file    Attends religious service: Not on file    Active member of club or organization: Not on file    Attends meetings of clubs or organizations: Not on file    Relationship status: Not on file  Other Topics Concern  . Not on file  Social History Narrative  . Not on file    Current Outpatient Medications on File Prior to Visit  Medication Sig Dispense Refill  . Cholecalciferol (VITAMIN D) 50 MCG (2000 UT) tablet Take 2,000 Units by mouth daily.    [name] lisinopril (ZESTRIL) 2.5 MG tablet Take 2.5 mg by mouth daily.    . metoprolol tartrate (LOPRESSOR) 25 MG tablet Take 25 mg by mouth 2 (two) times daily.    [name] omeprazole (PRILOSEC) 20 MG capsule Take 20 mg by mouth at bedtime.    . pravastatin (PRAVACHOL) 40 MG tablet Take 40 mg by mouth at bedtime.    . vitamin C (ASCORBIC ACID) 500 MG tablet Take 500 mg by mouth daily.     No current facility-administered medications on file prior to visit.     Review of Systems: a complete, 10pt review of systems was completed with pertinent positives and negatives as documented in the HPI  Physical Exam: 04/15/2019 10:31 AM Weight: 402.6 lb Height: 66in Body Surface Area: 2.69 m Body Mass Index: 64.98 kg/m  Temp.: 97.49F  Pulse: 92 (Regular)  BP: 132/72(Sitting, Left Arm, Standard)  Gen: A&Ox3, no distress  Head: normocephalic, atraumatic Eyes: extraocular motions intact, anicteric.  Neck: supple without mass or thyromegaly Chest: unlabored respirations, symmetrical air entry, clear bilaterally   Cardiovascular: RRR with  palpable distal pulses, no pedal edema Abdomen: soft, nondistended, nontender. No mass or organomegaly.  Extremities: warm, without edema, no deformities  Neuro: grossly intact Psych: appropriate mood and affect, normal insight  Skin: warm and dry   No flowsheet data found.  CMP Latest Ref Rng & Units 02/01/2019 10/05/2018  Creatinine 0.44 - 1.00 mg/dL 1.20(H) 1.30(H)    No results found for: INR, PROTIME  Imaging: No results found.   A/P:  MORBID OBESITY, UNSPECIFIED OBESITY TYPE (E66.01) She remains an excellent candidate for laparoscopic Roux-en-Y gastric bypass and we discussed the surgery including the risks both perioperative and long-term, once more in the office today. She has completed the preoperative requirements and reports compliance with the preop diet. Plan to proceed with surgery on 04/27/19.   OBSTRUCTIVE SLEEP APNEA ON CPAP (G47.33) HYPERTENSION (I10) SOLITARY LEFT KIDNEY (Q60.0) - hold periop NSAIDS HISTORY OF CANCER (Z85.9) (uterine, renal) HISTORY OF CHOLECYSTECTOMY (Z90.49) OSTEOARTHRITIS (M19.90) GERD (GASTROESOPHAGEAL REFLUX DISEASE) (K21.9)  Romana Juniper, MD [facility], [place] Pager [phone]

## 2019-04-22 NOTE — H&P (View-Only) (Signed)
Surgical H&P  CC: severe obesity  HPI:  This is a 63 year old woman who was initially evaluated by Dr. Excell Seltzer in January of this year and initiated on the pathway for a laparoscopic Roux-en-Y gastric bypass. She has a history of morbid obesity unresponsive to multiple efforts at medical management. She is following up today for surgical management of her morbid obesity and to establish care with me.  She has nearly completed her bariatric workup but still needs to have labs and one more visit with a psychologist, which is scheduled for this afternoon. She did not have a chest x-ray or upper GI that I can see however she did have a CT chest in May which is sufficient from my standpoint for preoperative imaging. She has succeeded in reducing her BMI to just under 65. She has consistently followed up with the dietitians monthly from February through June. She reported a history of atrial fibrillation which was an isolated issue surrounding likely sepsis from a gangrenous gallbladder which was treated with a percutaneous cholecystostomy tube at [facility] prior to her subsequent right nephrectomy and cholecystectomy. Subsequent Holter monitoring did not demonstrate any atrial fibrillation. This was felt to be reactive/provoked and no further cardiology follow-up was recommended by the cardiologist who evaluated her at the time.   Her comorbidities include obstructive sleep apnea on CPAP, osteoarthritis, GERD, hypertension and elevated cholesterol. She has a solitary left kidney and her baseline creatinine seems to be about 1.2-1.3.  Over the last 6 months, she has had no major changes in her health.   From Dr. Lear Ng note in January of this year : "The patient gives a history of progressive obesity since early adulthood despite multiple attempts at medical management. She has been able to lose a significant amount of weight in years past with diet and exercise. Actually lost 100 pounds with  Weight Watchers in the early 1990s and got down to a normal weight but then had experienced progressive weight regain. She has lost weight through other measures over the years but then regains this and additional weight. Her husband has had multiple medical issues, for renal transplant since she tends to eat nor her health when he gets sick. Obesity has been affecting the patient in a number of ways including increasing difficulty with routine daily activities and mobility. Significant co-morbid illnesses have developed including obstructive sleep apnea, hypertension, elevated cholesterol, bone-on-bone arthritis in her left knee and GERD. The patient has been to our initial information seminar, researched surgical options thoroughly, and is interested in gastric bypass due to her concern over worsening her reflux. Her reflux is easily managed on omeprazole. She has a significant history of uterine cancer with laparoscopic TAH/BSO in 2014 and then right renal mass status post right nephrectomy and cholecystectomy in 2018, all surgery in [place]. She is followed here by Dr. Gloriann Loan for urology. Staging CT just completed last week showed only a nonspecific mildly enlarged porta hepatis node. She is accompanied today by her husband.".  No Known Allergies  Past Medical History:  Diagnosis Date  . Hypertension   . right renal ca dx'd 10/2016   rt nephrectomy  . Uterine cancer ([place]) dx'd 2014   hysterectomy    No past surgical history on file.  No family history on file.  Social History   Socioeconomic History  . Marital status: Married    Spouse name: Not on file  . Number of children: Not on file  . Years of education: Not on file  .  Highest education level: Not on file  Occupational History  . Not on file  Social Needs  . Financial resource strain: Not on file  . Food insecurity    Worry: Not on file    Inability: Not on file  . Transportation needs    Medical: Not on file     Non-medical: Not on file  Tobacco Use  . Smoking status: Not on file  Substance and Sexual Activity  . Alcohol use: Not on file  . Drug use: Not on file  . Sexual activity: Not on file  Lifestyle  . Physical activity    Days per week: Not on file    Minutes per session: Not on file  . Stress: Not on file  Relationships  . Social Herbalist on phone: Not on file    Gets together: Not on file    Attends religious service: Not on file    Active member of club or organization: Not on file    Attends meetings of clubs or organizations: Not on file    Relationship status: Not on file  Other Topics Concern  . Not on file  Social History Narrative  . Not on file    Current Outpatient Medications on File Prior to Visit  Medication Sig Dispense Refill  . Cholecalciferol (VITAMIN D) 50 MCG (2000 UT) tablet Take 2,000 Units by mouth daily.    [name] lisinopril (ZESTRIL) 2.5 MG tablet Take 2.5 mg by mouth daily.    . metoprolol tartrate (LOPRESSOR) 25 MG tablet Take 25 mg by mouth 2 (two) times daily.    [name] omeprazole (PRILOSEC) 20 MG capsule Take 20 mg by mouth at bedtime.    . pravastatin (PRAVACHOL) 40 MG tablet Take 40 mg by mouth at bedtime.    . vitamin C (ASCORBIC ACID) 500 MG tablet Take 500 mg by mouth daily.     No current facility-administered medications on file prior to visit.     Review of Systems: a complete, 10pt review of systems was completed with pertinent positives and negatives as documented in the HPI  Physical Exam: 04/15/2019 10:31 AM Weight: 402.6 lb Height: 66in Body Surface Area: 2.69 m Body Mass Index: 64.98 kg/m  Temp.: 97.72F  Pulse: 92 (Regular)  BP: 132/72(Sitting, Left Arm, Standard)  Gen: A&Ox3, no distress  Head: normocephalic, atraumatic Eyes: extraocular motions intact, anicteric.  Neck: supple without mass or thyromegaly Chest: unlabored respirations, symmetrical air entry, clear bilaterally   Cardiovascular: RRR with  palpable distal pulses, no pedal edema Abdomen: soft, nondistended, nontender. No mass or organomegaly.  Extremities: warm, without edema, no deformities  Neuro: grossly intact Psych: appropriate mood and affect, normal insight  Skin: warm and dry   No flowsheet data found.  CMP Latest Ref Rng & Units 02/01/2019 10/05/2018  Creatinine 0.44 - 1.00 mg/dL 1.20(H) 1.30(H)    No results found for: INR, PROTIME  Imaging: No results found.   A/P:  MORBID OBESITY, UNSPECIFIED OBESITY TYPE (E66.01) She remains an excellent candidate for laparoscopic Roux-en-Y gastric bypass and we discussed the surgery including the risks both perioperative and long-term, once more in the office today. She has completed the preoperative requirements and reports compliance with the preop diet. Plan to proceed with surgery on 04/27/19.   OBSTRUCTIVE SLEEP APNEA ON CPAP (G47.33) HYPERTENSION (I10) SOLITARY LEFT KIDNEY (Q60.0) - hold periop NSAIDS HISTORY OF CANCER (Z85.9) (uterine, renal) HISTORY OF CHOLECYSTECTOMY (Z90.49) OSTEOARTHRITIS (M19.90) GERD (GASTROESOPHAGEAL REFLUX DISEASE) (K21.9)  Romana Juniper, MD [facility], [place] Pager [phone]

## 2019-04-23 ENCOUNTER — Other Ambulatory Visit (HOSPITAL_COMMUNITY)
Admission: RE | Admit: 2019-04-23 | Discharge: 2019-04-23 | Disposition: A | Discharge status: Home / Self Care | Payer: BC Managed Care – PPO | Source: Ambulatory Visit | Attending: Surgery | Admitting: Surgery

## 2019-04-23 DIAGNOSIS — Z1159 Encounter for screening for other viral diseases: Secondary | ICD-10-CM | POA: Diagnosis not present

## 2019-04-24 LAB — SARS CORONAVIRUS 2 (TAT 6-24 HRS): SARS Coronavirus 2: NEGATIVE

## 2019-04-26 ENCOUNTER — Other Ambulatory Visit (HOSPITAL_COMMUNITY): Payer: BC Managed Care – PPO

## 2019-04-26 ENCOUNTER — Encounter (HOSPITAL_COMMUNITY): Payer: Self-pay

## 2019-04-26 ENCOUNTER — Other Ambulatory Visit: Payer: Self-pay

## 2019-04-26 ENCOUNTER — Encounter (HOSPITAL_COMMUNITY)
Admission: RE | Admit: 2019-04-26 | Discharge: 2019-04-26 | Disposition: A | Discharge status: Home / Self Care | Payer: BC Managed Care – PPO | Source: Ambulatory Visit | Attending: Surgery | Admitting: Surgery

## 2019-04-26 DIAGNOSIS — Z8542 Personal history of malignant neoplasm of other parts of uterus: Secondary | ICD-10-CM | POA: Diagnosis not present

## 2019-04-26 DIAGNOSIS — I10 Essential (primary) hypertension: Secondary | ICD-10-CM | POA: Diagnosis not present

## 2019-04-26 DIAGNOSIS — E7800 Pure hypercholesterolemia, unspecified: Secondary | ICD-10-CM | POA: Diagnosis not present

## 2019-04-26 DIAGNOSIS — G4733 Obstructive sleep apnea (adult) (pediatric): Secondary | ICD-10-CM | POA: Diagnosis not present

## 2019-04-26 DIAGNOSIS — M1712 Unilateral primary osteoarthritis, left knee: Secondary | ICD-10-CM | POA: Diagnosis not present

## 2019-04-26 DIAGNOSIS — Z6841 Body Mass Index (BMI) 40.0 and over, adult: Secondary | ICD-10-CM | POA: Diagnosis not present

## 2019-04-26 DIAGNOSIS — K219 Gastro-esophageal reflux disease without esophagitis: Secondary | ICD-10-CM | POA: Diagnosis not present

## 2019-04-26 DIAGNOSIS — Z85528 Personal history of other malignant neoplasm of kidney: Secondary | ICD-10-CM | POA: Diagnosis not present

## 2019-04-26 DIAGNOSIS — Z01818 Encounter for other preprocedural examination: Secondary | ICD-10-CM | POA: Insufficient documentation

## 2019-04-26 HISTORY — DX: Sleep apnea, unspecified: G47.30

## 2019-04-26 LAB — ABO/RH: ABO/RH(D): O POS

## 2019-04-26 MED ORDER — BUPIVACAINE LIPOSOME 1.3 % IJ SUSP
20.0000 mL | Freq: Once | INTRAMUSCULAR | Status: DC
  Filled 2019-04-26: qty 20

## 2019-04-27 ENCOUNTER — Encounter (HOSPITAL_COMMUNITY)
Admission: RE | Disposition: A | Discharge status: Home / Self Care | Payer: Self-pay | Source: Home / Self Care | Attending: Surgery

## 2019-04-27 ENCOUNTER — Other Ambulatory Visit: Payer: Self-pay

## 2019-04-27 ENCOUNTER — Inpatient Hospital Stay (HOSPITAL_COMMUNITY): Payer: BC Managed Care – PPO | Admitting: Physician Assistant

## 2019-04-27 ENCOUNTER — Inpatient Hospital Stay (HOSPITAL_COMMUNITY): Payer: BC Managed Care – PPO | Admitting: Anesthesiology

## 2019-04-27 ENCOUNTER — Inpatient Hospital Stay (HOSPITAL_COMMUNITY)
Admission: RE | Admit: 2019-04-27 | Discharge: 2019-04-29 | DRG: 621 | Disposition: A | Discharge status: Home / Self Care | Payer: BC Managed Care – PPO | Attending: Surgery | Admitting: Surgery

## 2019-04-27 ENCOUNTER — Encounter (HOSPITAL_COMMUNITY): Payer: Self-pay | Admitting: Emergency Medicine

## 2019-04-27 DIAGNOSIS — Z8542 Personal history of malignant neoplasm of other parts of uterus: Secondary | ICD-10-CM | POA: Diagnosis not present

## 2019-04-27 DIAGNOSIS — G4733 Obstructive sleep apnea (adult) (pediatric): Secondary | ICD-10-CM | POA: Diagnosis present

## 2019-04-27 DIAGNOSIS — Z85528 Personal history of other malignant neoplasm of kidney: Secondary | ICD-10-CM

## 2019-04-27 DIAGNOSIS — K219 Gastro-esophageal reflux disease without esophagitis: Secondary | ICD-10-CM | POA: Diagnosis present

## 2019-04-27 DIAGNOSIS — M1712 Unilateral primary osteoarthritis, left knee: Secondary | ICD-10-CM | POA: Diagnosis present

## 2019-04-27 DIAGNOSIS — Z6841 Body Mass Index (BMI) 40.0 and over, adult: Secondary | ICD-10-CM | POA: Diagnosis not present

## 2019-04-27 DIAGNOSIS — I10 Essential (primary) hypertension: Secondary | ICD-10-CM | POA: Diagnosis present

## 2019-04-27 DIAGNOSIS — Z9884 Bariatric surgery status: Secondary | ICD-10-CM

## 2019-04-27 DIAGNOSIS — E7800 Pure hypercholesterolemia, unspecified: Secondary | ICD-10-CM | POA: Diagnosis present

## 2019-04-27 HISTORY — DX: Bariatric surgery status: Z98.84

## 2019-04-27 HISTORY — PX: GASTRIC ROUX-EN-Y: SHX5262

## 2019-04-27 LAB — TYPE AND SCREEN
ABO/RH(D): O POS
Antibody Screen: NEGATIVE

## 2019-04-27 SURGERY — LAPAROSCOPIC ROUX-EN-Y GASTRIC BYPASS WITH UPPER ENDOSCOPY
Anesthesia: General | Site: Abdomen

## 2019-04-27 MED ORDER — FENTANYL CITRATE (PF) 100 MCG/2ML IJ SOLN
25.0000 ug | INTRAMUSCULAR | Status: DC | PRN
  Administered 2019-04-27 (×3): 50 ug via INTRAVENOUS

## 2019-04-27 MED ORDER — MIDAZOLAM HCL 2 MG/2ML IJ SOLN
INTRAMUSCULAR | Status: AC
  Filled 2019-04-27: qty 2

## 2019-04-27 MED ORDER — PROPOFOL 10 MG/ML IV BOLUS
INTRAVENOUS | Status: AC
  Filled 2019-04-27: qty 20

## 2019-04-27 MED ORDER — LIDOCAINE HCL 2 % IJ SOLN
INTRAMUSCULAR | Status: AC
  Filled 2019-04-27: qty 20

## 2019-04-27 MED ORDER — LIDOCAINE 2% (20 MG/ML) 5 ML SYRINGE
INTRAMUSCULAR | Status: AC
  Filled 2019-04-27: qty 5

## 2019-04-27 MED ORDER — PROPOFOL 10 MG/ML IV BOLUS
INTRAVENOUS | Status: DC | PRN
  Administered 2019-04-27: 150 mg via INTRAVENOUS

## 2019-04-27 MED ORDER — METOPROLOL TARTRATE 5 MG/5ML IV SOLN: 5.0000 mg | Freq: Four times a day (QID) | INTRAVENOUS | Status: DC | PRN

## 2019-04-27 MED ORDER — HYDRALAZINE HCL 20 MG/ML IJ SOLN: 10.0000 mg | INTRAMUSCULAR | Status: DC | PRN

## 2019-04-27 MED ORDER — ONDANSETRON HCL 4 MG/2ML IJ SOLN
INTRAMUSCULAR | Status: AC
  Filled 2019-04-27: qty 2

## 2019-04-27 MED ORDER — BUPIVACAINE LIPOSOME 1.3 % IJ SUSP
INTRAMUSCULAR | Status: DC | PRN
  Administered 2019-04-27: 20 mL

## 2019-04-27 MED ORDER — DEXAMETHASONE SODIUM PHOSPHATE 4 MG/ML IJ SOLN: 4.0000 mg | INTRAMUSCULAR | Status: DC

## 2019-04-27 MED ORDER — FENTANYL CITRATE (PF) 250 MCG/5ML IJ SOLN
INTRAMUSCULAR | Status: AC
  Filled 2019-04-27: qty 5

## 2019-04-27 MED ORDER — FENTANYL CITRATE (PF) 100 MCG/2ML IJ SOLN
INTRAMUSCULAR | Status: AC
  Filled 2019-04-27: qty 2

## 2019-04-27 MED ORDER — ROCURONIUM BROMIDE 10 MG/ML (PF) SYRINGE
PREFILLED_SYRINGE | INTRAVENOUS | Status: AC
  Filled 2019-04-27: qty 10

## 2019-04-27 MED ORDER — DEXAMETHASONE SODIUM PHOSPHATE 10 MG/ML IJ SOLN
INTRAMUSCULAR | Status: AC
  Filled 2019-04-27: qty 1

## 2019-04-27 MED ORDER — ACETAMINOPHEN 500 MG PO TABS
1000.0000 mg | ORAL_TABLET | Freq: Three times a day (TID) | ORAL | Status: DC
  Administered 2019-04-27 – 2019-04-29 (×4): 1000 mg via ORAL
  Filled 2019-04-27 (×5): qty 2

## 2019-04-27 MED ORDER — BUPIVACAINE-EPINEPHRINE (PF) 0.25% -1:200000 IJ SOLN
INTRAMUSCULAR | Status: AC
  Filled 2019-04-27: qty 30

## 2019-04-27 MED ORDER — SCOPOLAMINE 1 MG/3DAYS TD PT72
1.0000 | MEDICATED_PATCH | TRANSDERMAL | Status: DC
  Administered 2019-04-27: 1.5 mg via TRANSDERMAL
  Filled 2019-04-27: qty 1

## 2019-04-27 MED ORDER — ENSURE MAX PROTEIN PO LIQD
2.0000 [oz_av] | ORAL | Status: DC
  Administered 2019-04-28 – 2019-04-29 (×11): 2 [oz_av] via ORAL

## 2019-04-27 MED ORDER — VISTASEAL 10 ML SINGLE DOSE KIT
10.0000 mL | PACK | Freq: Once | CUTANEOUS | Status: AC
  Administered 2019-04-27: 5 mL via TOPICAL
  Filled 2019-04-27: qty 10

## 2019-04-27 MED ORDER — MIDAZOLAM HCL 2 MG/2ML IJ SOLN
INTRAMUSCULAR | Status: DC | PRN
  Administered 2019-04-27: 2 mg via INTRAVENOUS

## 2019-04-27 MED ORDER — TRAMADOL HCL 50 MG PO TABS: 50.0000 mg | ORAL_TABLET | Freq: Four times a day (QID) | ORAL | Status: DC | PRN

## 2019-04-27 MED ORDER — METOPROLOL TARTRATE 25 MG PO TABS
25.0000 mg | ORAL_TABLET | Freq: Two times a day (BID) | ORAL | Status: DC
  Administered 2019-04-28 – 2019-04-29 (×3): 25 mg via ORAL
  Filled 2019-04-27 (×3): qty 1

## 2019-04-27 MED ORDER — LIDOCAINE 2% (20 MG/ML) 5 ML SYRINGE
INTRAMUSCULAR | Status: DC | PRN
  Administered 2019-04-27: 1.5 mg/kg/h via INTRAVENOUS

## 2019-04-27 MED ORDER — SUGAMMADEX SODIUM 500 MG/5ML IV SOLN
INTRAVENOUS | Status: AC
  Filled 2019-04-27: qty 5

## 2019-04-27 MED ORDER — KETAMINE HCL 10 MG/ML IJ SOLN
INTRAMUSCULAR | Status: DC | PRN
  Administered 2019-04-27: 50 mg via INTRAVENOUS

## 2019-04-27 MED ORDER — GABAPENTIN 300 MG PO CAPS
300.0000 mg | ORAL_CAPSULE | ORAL | Status: AC
  Administered 2019-04-27: 300 mg via ORAL
  Filled 2019-04-27: qty 1

## 2019-04-27 MED ORDER — APREPITANT 40 MG PO CAPS
40.0000 mg | ORAL_CAPSULE | ORAL | Status: AC
  Administered 2019-04-27: 40 mg via ORAL
  Filled 2019-04-27: qty 1

## 2019-04-27 MED ORDER — ENOXAPARIN (LOVENOX) PATIENT EDUCATION KIT
PACK | Freq: Once | Status: AC
  Administered 2019-04-27: 21:00:00
  Filled 2019-04-27: qty 1

## 2019-04-27 MED ORDER — DEXAMETHASONE SODIUM PHOSPHATE 10 MG/ML IJ SOLN
INTRAMUSCULAR | Status: DC | PRN
  Administered 2019-04-27: 10 mg via INTRAVENOUS

## 2019-04-27 MED ORDER — EPHEDRINE 5 MG/ML INJ
INTRAVENOUS | Status: AC
  Filled 2019-04-27: qty 10

## 2019-04-27 MED ORDER — LIDOCAINE 2% (20 MG/ML) 5 ML SYRINGE
INTRAMUSCULAR | Status: DC | PRN
  Administered 2019-04-27: 100 mg via INTRAVENOUS

## 2019-04-27 MED ORDER — STERILE WATER FOR IRRIGATION IR SOLN
Status: DC | PRN
  Administered 2019-04-27: 1000 mL

## 2019-04-27 MED ORDER — FENTANYL CITRATE (PF) 250 MCG/5ML IJ SOLN
INTRAMUSCULAR | Status: DC | PRN
  Administered 2019-04-27 (×3): 50 ug via INTRAVENOUS
  Administered 2019-04-27 (×2): 100 ug via INTRAVENOUS

## 2019-04-27 MED ORDER — ACETAMINOPHEN 160 MG/5ML PO SOLN
1000.0000 mg | Freq: Three times a day (TID) | ORAL | Status: DC
  Administered 2019-04-28: 1000 mg via ORAL
  Filled 2019-04-27: qty 40.6

## 2019-04-27 MED ORDER — MENTHOL 3 MG MT LOZG
1.0000 | LOZENGE | OROMUCOSAL | Status: DC | PRN
  Filled 2019-04-27: qty 9

## 2019-04-27 MED ORDER — SIMETHICONE 80 MG PO CHEW: 80.0000 mg | CHEWABLE_TABLET | Freq: Four times a day (QID) | ORAL | Status: DC | PRN

## 2019-04-27 MED ORDER — OXYCODONE HCL 5 MG/5ML PO SOLN: 5.0000 mg | Freq: Four times a day (QID) | ORAL | Status: DC | PRN

## 2019-04-27 MED ORDER — ENOXAPARIN SODIUM 30 MG/0.3ML ~~LOC~~ SOLN
30.0000 mg | Freq: Two times a day (BID) | SUBCUTANEOUS | Status: DC
  Administered 2019-04-28: 30 mg via SUBCUTANEOUS
  Filled 2019-04-27: qty 0.3

## 2019-04-27 MED ORDER — ONDANSETRON HCL 4 MG/2ML IJ SOLN
4.0000 mg | INTRAMUSCULAR | Status: DC | PRN
  Administered 2019-04-27: 4 mg via INTRAVENOUS
  Filled 2019-04-27: qty 2

## 2019-04-27 MED ORDER — METHOCARBAMOL 1000 MG/10ML IJ SOLN
500.0000 mg | Freq: Four times a day (QID) | INTRAVENOUS | Status: DC | PRN
  Filled 2019-04-27: qty 5

## 2019-04-27 MED ORDER — PANTOPRAZOLE SODIUM 40 MG IV SOLR
40.0000 mg | Freq: Every day | INTRAVENOUS | Status: DC
  Administered 2019-04-27 – 2019-04-28 (×2): 40 mg via INTRAVENOUS
  Filled 2019-04-27 (×2): qty 40

## 2019-04-27 MED ORDER — CHLORHEXIDINE GLUCONATE 4 % EX LIQD: 60.0000 mL | Freq: Once | CUTANEOUS | Status: DC

## 2019-04-27 MED ORDER — SODIUM CHLORIDE 0.9 % IV SOLN
2.0000 g | INTRAVENOUS | Status: AC
  Administered 2019-04-27: 2 g via INTRAVENOUS
  Filled 2019-04-27: qty 2

## 2019-04-27 MED ORDER — SUCCINYLCHOLINE CHLORIDE 200 MG/10ML IV SOSY
PREFILLED_SYRINGE | INTRAVENOUS | Status: AC
  Filled 2019-04-27: qty 10

## 2019-04-27 MED ORDER — HYDROMORPHONE HCL 1 MG/ML IJ SOLN: 0.5000 mg | INTRAMUSCULAR | Status: DC | PRN

## 2019-04-27 MED ORDER — BUPIVACAINE-EPINEPHRINE 0.25% -1:200000 IJ SOLN
INTRAMUSCULAR | Status: DC | PRN
  Administered 2019-04-27: 30 mL

## 2019-04-27 MED ORDER — ROCURONIUM BROMIDE 10 MG/ML (PF) SYRINGE
PREFILLED_SYRINGE | INTRAVENOUS | Status: DC | PRN
  Administered 2019-04-27: 70 mg via INTRAVENOUS
  Administered 2019-04-27: 5 mg via INTRAVENOUS
  Administered 2019-04-27: 10 mg via INTRAVENOUS
  Administered 2019-04-27: 20 mg via INTRAVENOUS

## 2019-04-27 MED ORDER — LACTATED RINGERS IV SOLN
INTRAVENOUS | Status: DC
  Administered 2019-04-27 (×2): via INTRAVENOUS

## 2019-04-27 MED ORDER — LACTATED RINGERS IR SOLN
Status: DC | PRN
  Administered 2019-04-27: 1000 mL

## 2019-04-27 MED ORDER — SUCCINYLCHOLINE CHLORIDE 200 MG/10ML IV SOSY
PREFILLED_SYRINGE | INTRAVENOUS | Status: DC | PRN
  Administered 2019-04-27: 200 mg via INTRAVENOUS

## 2019-04-27 MED ORDER — EVICEL 5 ML EX KIT
PACK | Freq: Once | CUTANEOUS | Status: AC
  Administered 2019-04-27: 5 mL
  Filled 2019-04-27: qty 1

## 2019-04-27 MED ORDER — SUGAMMADEX SODIUM 200 MG/2ML IV SOLN
INTRAVENOUS | Status: DC | PRN
  Administered 2019-04-27: 400 mg via INTRAVENOUS

## 2019-04-27 MED ORDER — DOCUSATE SODIUM 100 MG PO CAPS
100.0000 mg | ORAL_CAPSULE | Freq: Two times a day (BID) | ORAL | Status: DC
  Administered 2019-04-28 – 2019-04-29 (×3): 100 mg via ORAL
  Filled 2019-04-27 (×3): qty 1

## 2019-04-27 MED ORDER — GABAPENTIN 100 MG PO CAPS
200.0000 mg | ORAL_CAPSULE | Freq: Two times a day (BID) | ORAL | Status: DC
  Administered 2019-04-27 – 2019-04-29 (×4): 200 mg via ORAL
  Filled 2019-04-27 (×4): qty 2

## 2019-04-27 MED ORDER — ONDANSETRON HCL 4 MG/2ML IJ SOLN
INTRAMUSCULAR | Status: DC | PRN
  Administered 2019-04-27: 4 mg via INTRAVENOUS

## 2019-04-27 MED ORDER — SODIUM CHLORIDE 0.9 % IV SOLN
INTRAVENOUS | Status: DC
  Administered 2019-04-27 – 2019-04-29 (×6): via INTRAVENOUS

## 2019-04-27 MED ORDER — EPHEDRINE SULFATE-NACL 50-0.9 MG/10ML-% IV SOSY
PREFILLED_SYRINGE | INTRAVENOUS | Status: DC | PRN
  Administered 2019-04-27: 10 mg via INTRAVENOUS

## 2019-04-27 MED ORDER — ACETAMINOPHEN 500 MG PO TABS
1000.0000 mg | ORAL_TABLET | ORAL | Status: AC
  Administered 2019-04-27: 1000 mg via ORAL
  Filled 2019-04-27: qty 2

## 2019-04-27 MED ORDER — ENOXAPARIN SODIUM 40 MG/0.4ML ~~LOC~~ SOLN
40.0000 mg | SUBCUTANEOUS | Status: AC
  Administered 2019-04-27: 40 mg via SUBCUTANEOUS
  Filled 2019-04-27: qty 0.4

## 2019-04-27 MED ORDER — 0.9 % SODIUM CHLORIDE (POUR BTL) OPTIME
TOPICAL | Status: DC | PRN
  Administered 2019-04-27: 1000 mL

## 2019-04-27 SURGICAL SUPPLY — 82 items
APPLICATOR COTTON TIP 6 STRL (MISCELLANEOUS) IMPLANT
APPLICATOR COTTON TIP 6IN STRL (MISCELLANEOUS)
APPLICATOR VISTASEAL 35 (MISCELLANEOUS) ×2 IMPLANT
APPLIER CLIP ROT 13.4 12 LRG (CLIP)
BENZOIN TINCTURE PRP APPL 2/3 (GAUZE/BANDAGES/DRESSINGS) ×2 IMPLANT
BLADE SURG SZ11 CARB STEEL (BLADE) ×2 IMPLANT
BNDG ADH 1X3 SHEER STRL LF (GAUZE/BANDAGES/DRESSINGS) ×12 IMPLANT
CABLE HIGH FREQUENCY MONO STRZ (ELECTRODE) ×1 IMPLANT
CHLORAPREP W/TINT 26 (MISCELLANEOUS) ×4 IMPLANT
CLIP APPLIE ROT 13.4 12 LRG (CLIP) IMPLANT
CLIP SUT LAPRA TY ABSORB (SUTURE) ×4 IMPLANT
COVER SURGICAL LIGHT HANDLE (MISCELLANEOUS) ×2 IMPLANT
COVER WAND RF STERILE (DRAPES) IMPLANT
DERMABOND ADVANCED (GAUZE/BANDAGES/DRESSINGS)
DERMABOND ADVANCED .7 DNX12 (GAUZE/BANDAGES/DRESSINGS) IMPLANT
DEVICE SUT QUICK LOAD TK 5 (STAPLE) IMPLANT
DEVICE SUT TI-KNOT TK 5X26 (MISCELLANEOUS) IMPLANT
DEVICE SUTURE ENDOST 10MM (ENDOMECHANICALS) ×2 IMPLANT
DRAIN PENROSE 18X1/4 LTX STRL (WOUND CARE) ×2 IMPLANT
ELECT REM PT RETURN 15FT ADLT (MISCELLANEOUS) ×2 IMPLANT
GAUZE 4X4 16PLY RFD (DISPOSABLE) ×2 IMPLANT
GAUZE SPONGE 4X4 12PLY STRL (GAUZE/BANDAGES/DRESSINGS) IMPLANT
GLOVE BIO SURGEON STRL SZ 6 (GLOVE) ×2 IMPLANT
GLOVE BIO SURGEON STRL SZ 6.5 (GLOVE) ×1 IMPLANT
GLOVE BIOGEL M 8.0 STRL (GLOVE) ×1 IMPLANT
GLOVE BIOGEL PI IND STRL 6.5 (GLOVE) IMPLANT
GLOVE BIOGEL PI IND STRL 7.0 (GLOVE) IMPLANT
GLOVE BIOGEL PI INDICATOR 6.5 (GLOVE) ×1
GLOVE BIOGEL PI INDICATOR 7.0 (GLOVE) ×2
GLOVE INDICATOR 6.5 STRL GRN (GLOVE) ×2 IMPLANT
GLOVE SURG SS PI 7.0 STRL IVOR (GLOVE) ×2 IMPLANT
GOWN STRL REUS W/TWL LRG LVL3 (GOWN DISPOSABLE) ×4 IMPLANT
GOWN STRL REUS W/TWL XL LVL3 (GOWN DISPOSABLE) ×4 IMPLANT
HOVERMATT SINGLE USE (MISCELLANEOUS) ×2 IMPLANT
KIT BASIN OR (CUSTOM PROCEDURE TRAY) ×2 IMPLANT
KIT GASTRIC LAVAGE 34FR ADT (SET/KITS/TRAYS/PACK) ×2 IMPLANT
KIT TURNOVER KIT A (KITS) IMPLANT
MARKER SKIN DUAL TIP RULER LAB (MISCELLANEOUS) ×2 IMPLANT
NDL SPNL 22GX3.5 QUINCKE BK (NEEDLE) ×1 IMPLANT
NEEDLE SPNL 22GX3.5 QUINCKE BK (NEEDLE) ×2 IMPLANT
PACK CARDIOVASCULAR III (CUSTOM PROCEDURE TRAY) ×2 IMPLANT
RELOAD ENDO STITCH 2.0 (ENDOMECHANICALS) ×10
RELOAD STAPLE 60 2.6 WHT THN (STAPLE) ×2 IMPLANT
RELOAD STAPLE 60 3.6 BLU REG (STAPLE) ×2 IMPLANT
RELOAD STAPLE 60 3.8 GOLD REG (STAPLE) IMPLANT
RELOAD STAPLE 60 4.1 GRN THCK (STAPLE) ×1 IMPLANT
RELOAD STAPLER BLUE 60MM (STAPLE) ×4 IMPLANT
RELOAD STAPLER GOLD 60MM (STAPLE) ×1 IMPLANT
RELOAD STAPLER GREEN 60MM (STAPLE) IMPLANT
RELOAD STAPLER WHITE 60MM (STAPLE) ×2 IMPLANT
RELOAD SUT SNGL STCH ABSRB 2-0 (ENDOMECHANICALS) ×4 IMPLANT
RELOAD SUT SNGL STCH BLK 2-0 (ENDOMECHANICALS) ×4 IMPLANT
SCISSORS LAP 5X45 EPIX DISP (ENDOMECHANICALS) ×2 IMPLANT
SET IRRIG TUBING LAPAROSCOPIC (IRRIGATION / IRRIGATOR) ×2 IMPLANT
SET TUBE SMOKE EVAC HIGH FLOW (TUBING) ×2 IMPLANT
SHEARS HARMONIC ACE PLUS 45CM (MISCELLANEOUS) ×2 IMPLANT
SLEEVE XCEL OPT CAN 5 100 (ENDOMECHANICALS) ×7 IMPLANT
SOLUTION ANTI FOG 6CC (MISCELLANEOUS) ×2 IMPLANT
STAPLER ECHELON BIOABSB 60 FLE (MISCELLANEOUS) IMPLANT
STAPLER ECHELON LONG 60 440 (INSTRUMENTS) ×2 IMPLANT
STAPLER RELOAD BLUE 60MM (STAPLE) ×8
STAPLER RELOAD GOLD 60MM (STAPLE) ×2
STAPLER RELOAD GREEN 60MM (STAPLE)
STAPLER RELOAD WHITE 60MM (STAPLE) ×4
STRIP CLOSURE SKIN 1/2X4 (GAUZE/BANDAGES/DRESSINGS) ×3 IMPLANT
SUT MNCRL AB 4-0 PS2 18 (SUTURE) ×2 IMPLANT
SUT RELOAD ENDO STITCH 2 48X1 (ENDOMECHANICALS) ×6
SUT RELOAD ENDO STITCH 2.0 (ENDOMECHANICALS) ×4
SUT SURGIDAC NAB ES-9 0 48 120 (SUTURE) IMPLANT
SUT VIC AB 2-0 SH 27 (SUTURE) ×1
SUT VIC AB 2-0 SH 27X BRD (SUTURE) ×1 IMPLANT
SUTURE RELOAD END STTCH 2 48X1 (ENDOMECHANICALS) ×6 IMPLANT
SUTURE RELOAD ENDO STITCH 2.0 (ENDOMECHANICALS) ×4 IMPLANT
SYR 10ML ECCENTRIC (SYRINGE) ×2 IMPLANT
SYR 20ML LL LF (SYRINGE) ×4 IMPLANT
TOWEL OR 17X26 10 PK STRL BLUE (TOWEL DISPOSABLE) ×2 IMPLANT
TOWEL OR NON WOVEN STRL DISP B (DISPOSABLE) ×2 IMPLANT
TRAY FOLEY MTR SLVR 14FR STAT (SET/KITS/TRAYS/PACK) ×1 IMPLANT
TROCAR BLADELESS OPT 5 100 (ENDOMECHANICALS) IMPLANT
TROCAR UNIVERSAL OPT 12M 100M (ENDOMECHANICALS) ×2 IMPLANT
TROCAR XCEL 12X100 BLDLESS (ENDOMECHANICALS) ×2 IMPLANT
TUBING CONNECTING 10 (TUBING) ×1 IMPLANT

## 2019-04-27 NOTE — Anesthesia Postprocedure Evaluation (Signed)
Anesthesia Post Note  Patient: Kelsey Henson  Procedure(s) Performed: LAPAROSCOPIC ROUX-EN-Y GASTRIC BYPASS WITH UPPER ENDOSCOPY, ERAS Pathway (N/A Abdomen)     Patient location during evaluation: PACU Anesthesia Type: General Level of consciousness: awake and alert Pain management: pain level controlled Vital Signs Assessment: post-procedure vital signs reviewed and stable Respiratory status: spontaneous breathing, nonlabored ventilation, respiratory function stable and patient connected to nasal cannula oxygen Cardiovascular status: blood pressure returned to baseline and stable Postop Assessment: no apparent nausea or vomiting Anesthetic complications: no    Last Vitals:  Vitals:   04/27/19 1515 04/27/19 1520  BP: (!) 144/86 117/83  Pulse: (!) 57 73  Resp: 19 18  Temp: (!) 36.4 C   SpO2: 97% 97%    Last Pain:  Vitals:   04/27/19 1515  TempSrc:   PainSc: Asleep                 Ayslin Kundert L Desmon Hitchner

## 2019-04-27 NOTE — Transfer of Care (Signed)
Immediate Anesthesia Transfer of Care Note  Patient: Kelsey Henson  Procedure(s) Performed: LAPAROSCOPIC ROUX-EN-Y GASTRIC BYPASS WITH UPPER ENDOSCOPY, ERAS Pathway (N/A Abdomen)  Patient Location: PACU  Anesthesia Type:General  Level of Consciousness: drowsy  Airway & Oxygen Therapy: Patient Spontanous Breathing and Patient connected to face mask oxygen  Post-op Assessment: Report given to RN and Post -op Vital signs reviewed and stable  Post vital signs: Reviewed and stable  Last Vitals:  Vitals Value Taken Time  BP 148/97 04/27/19 1420  Temp    Pulse 78 04/27/19 1421  Resp 9 04/27/19 1421  SpO2 100 % 04/27/19 1421  Vitals shown include unvalidated device data.  Last Pain:  Vitals:   04/27/19 0828  TempSrc:   PainSc: 0-No pain      Patients Stated Pain Goal: 4 (67/25/50 0164)  Complications: No apparent anesthesia complications

## 2019-04-27 NOTE — Progress Notes (Addendum)
PHARMACY CONSULT FOR:  Risk Assessment for Post-Discharge VTE Following Bariatric Surgery  Post-Discharge VTE Risk Assessment: This patient's probability of 30-day post-discharge VTE is increased due to the factors marked:   Female  X  Age >/=60 years   X BMI >/=50 kg/m2    CHF    Dyspnea at Rest    Paraplegia   X Non-gastric-band surgery    Operation Time >/=3 hr    Return to OR     Length of Stay >/= 3 d   Predicted probability of 30-day post-discharge VTE: 0.52%  Other patient-specific factors to consider: none  Recommendation for Discharge: Enoxaparin 60 mg  q12h x 2 weeks post-discharge CM consult placed for co-pay information. Secure chat sent to Kelsey Henson is a 63 y.o. female who underwent laparoscopic Roux-en-Y gastric bypass 04/27/2019   No Known Allergies  Patient Measurements: Height: 5\' 6"  (167.6 cm) Weight: (!) 397 lb 3.2 oz (180.2 kg) IBW/kg (Calculated) : 59.3 Body mass index is 64.11 kg/m.  No results for input(s): WBC, HGB, HCT, PLT, APTT, CREATININE, LABCREA, CREATININE, CREAT24HRUR, MG, PHOS, ALBUMIN, PROT, ALBUMIN, AST, ALT, ALKPHOS, BILITOT, BILIDIR, IBILI in the last 72 hours. CrCl cannot be calculated (Patient's most recent lab result is older than the maximum 21 days allowed.).    Past Medical History:  Diagnosis Date  . Hypertension   . right renal ca dx'd 10/2016   rt nephrectomy  . Sleep apnea    wears CPAP  . Uterine cancer ([place]) dx'd 2014   hysterectomy     Medications Prior to Admission  Medication Sig Dispense Refill Last Dose  . Cholecalciferol (VITAMIN D) 50 MCG (2000 UT) tablet Take 2,000 Units by mouth daily.   04/26/2019 at Unknown time  . lisinopril (ZESTRIL) 2.5 MG tablet Take 2.5 mg by mouth daily.   04/26/2019 at Unknown time  . metoprolol tartrate (LOPRESSOR) 25 MG tablet Take 25 mg by mouth 2 (two) times daily.   04/27/2019 at 0700  . omeprazole (PRILOSEC) 20 MG capsule Take 20 mg by mouth at bedtime.    04/26/2019 at Unknown time  . pravastatin (PRAVACHOL) 40 MG tablet Take 40 mg by mouth at bedtime.   04/26/2019 at Unknown time  . vitamin C (ASCORBIC ACID) 500 MG tablet Take 500 mg by mouth daily.   04/26/2019 at Unknown time     Kelsey Henson, Kelsey Henson [id number] 04/27/2019 3:14 PM

## 2019-04-27 NOTE — Op Note (Signed)
Dawanda Mapel [id number] 1956/03/20 04/27/2019  Preoperative diagnosis: morbid obesity BMI > 60  Postoperative diagnosis: Same   Procedure: Upper endoscopy   Surgeon: Catalina Antigua B. Hassell Done  M.D., FACS   Anesthesia: Gen.   Indications for procedure: This patient was undergoing a roux en Y gastric bypass by Dr. Kae Heller. --endo to check for leaks or bleeding.     Description of procedure: The endoscopy was placed in the mouth and into the oropharynx and under endoscopic vision it was advanced to the esophagogastric junction.  The pouch was insufflated and measured to be ~ 5 cm.  Healthy anastomosis noted.  .   No bleeding or leaks were detected.  The scope was withdrawn without difficulty.     Matt B. Hassell Done, MD, FACS General, Bariatric, & Minimally Invasive Surgery [facility] Surgery, [place]

## 2019-04-27 NOTE — Op Note (Signed)
Operative Note  Daisi Kentner  [id number]  [id number]  04/27/2019   Surgeon: Romana Juniper MD   Assistant: Kaylyn Lim MD   Procedure performed: laparoscopic Roux-en-Y gastric bypass (antecolic, antegastric) , upper endoscopy   Preop diagnosis: Morbid obesity Body mass index is 64.11 kg/m., GERD, Obstructive sleep apnea on CPAP, hypertension, hypercholesterolemia, osteoarthritis. Solitary left kidney.  Post-op diagnosis/intraop findings: same   Specimens: none Retained items: none  EBL: minimal cc Complications: none   Description of procedure: After obtaining informed consent and administration of prophylactic lovenox in holding, the patient was taken to the operating room and placed supine on operating room table where general endotracheal anesthesia was initiated, preoperative antibiotics were administered, SCDs applied, and a formal timeout was performed. The abdomen was prepped and draped in usual sterile fashion. Peritoneal access was gained using a 38mm Visiport technique in the left upper quadrant and insufflation to 15 mmHg ensued without issue. Gross inspection revealed no evidence of injury. Under direct visualization a left paramedian 89mm trocar was inserted and careful adhesiolysis of omental and proximal transverse colon to the area of her prior right subcostal incision was undertaken to provide space for the remaining trocar. This was done bluntly and with the harmonic scalpel where safe ensuring no injury to the bowel. The remaining trochars were inserted- a right paramedian 28mm trocar and bilateral 67mm trocars.   A laparoscopic assisted bilateral taps block was performed using Exparel mixed with Marcaine. The omentum was reflected cephalad and the ligament of Treitz identified. The small bowel was followed to a point 40 cm distal to ligament of Treitz at which location the bowel was divided with a white load linear cutting stapler. A Penrose was sutured to the Roux side of the  staple line for future identification. The bowel was measured another 100 cm distal to this and and the site for the jejunojejunostomy was aligned with the end of the biliopancreatic limb. Enterotomies were made with the Harmonic scalpel and the anastomosis was created with the 60 mm white load linear cutting stapler. The common enterotomy was closed with running 3-0 Vicryl starting on either end and tying centrally. The mesenteric defect was closed with running silk suture secured with Lapra-Ty's. The anastomosis was inspected and appeared widely patent, hemostatic with no gaps in the suture line. Vistaseal was injected over the anastomosis. We then divided the omentum using the harmonic scalpel.   The patient was then placed in steep reverse Trendelenburg. The liver retractor was inserted through a subxiphoid incision and secured for fixed retraction of the left lobe. The Harmonic scalpel was used to enter the perigastric plane and the lesser sac at a point 5 cm distal to the GE junction on the lesser curve. The angle of His was gently bluntly dissected and the target shape of the pouch visualized to exclude any residual fundus. After confirming that all tubes have been removed from the stomach, the gastric pouch was created with serial fires of the linear cutting stapler. As we approached the angle of His, the Ewald tube was inserted to confirm no impingement on the GE junction.   The Roux limb with its attached Penrose drain was then identified and brought up to meet the gastric pouch ensuring no twist in the small bowel mesentery. The staple line of the small bowel is directed to the patient's left side. A running 3-0 Vicryl was used to create a posterior suture line for our anastomosis between the gastric pouch and the small bowel. Gastrotomy  and enterotomy was made with the Harmonic scalpel and a blue load linear cutting stapler was used to create a gastrojejunal anastomosis approximately 2.5cm wide. The  common enterotomy was closed with running 3-0 Vicryl starting at either end and tying centrally. At this juncture the Ewald tube was passed through the gastrojejunal anastomosis. An anterior seromuscular layer of running 3-0 Vicryl was used to complete the gastrojejunal anastomosis. The wall tube was removed without difficulty. The [place] space was closed with a figure-of-eight silk suture.   At this point Dr. Hassell Done performed an upper endoscopy with the Roux limb gently clamped with a bowel clamp. Irrigation is instilled in the upper abdomen for a leak test. Please see his separate operative note- the anastomosis is noted to be patent and hemostatic without any leak or bubbles present. The gastric pouch measured approximately 5cm in length. The endoscope was removed and the abdomen once again surveyed. Evicel was injected along the gastrojejunostomy. The liver retractor was removed under direct visualization. The abdomen was then desufflated and all remaining trochars removed. The skin incisions were closed with running subcuticular Monocryl; benzoin, Steri-Strips and Band-Aids were applied The patient was then awakened, extubated and taken to PACU in stable condition.     All counts were correct at the completion of the case.     I updated her husband by phone at the end of the case.

## 2019-04-27 NOTE — Progress Notes (Signed)
Discussed post op day goals with patient including ambulation, IS, diet progression, pain, and nausea control.  BSTOP education provided including BSTOP information sheet, "Guide for Pain Management after Bariatric Procedure". Questions answered.

## 2019-04-27 NOTE — Anesthesia Preprocedure Evaluation (Addendum)
Anesthesia Evaluation  Patient identified by MRN, date of birth, ID band Patient awake    Reviewed: Allergy & Precautions, NPO status , Patient's Chart, lab work & pertinent test results, reviewed documented beta blocker date and time   Airway Mallampati: II  TM Distance: >3 FB Neck ROM: Full    Dental no notable dental hx. (+) Teeth Intact, Dental Advisory Given   Pulmonary sleep apnea and Continuous Positive Airway Pressure Ventilation ,    Pulmonary exam normal breath sounds clear to auscultation       Cardiovascular hypertension, Pt. on medications and Pt. on home beta blockers negative cardio ROS Normal cardiovascular exam Rhythm:Regular Rate:Normal     Neuro/Psych negative neurological ROS  negative psych ROS   GI/Hepatic Neg liver ROS, GERD  Medicated,  Endo/Other  Morbid obesity  Renal/GU negative Renal ROS  negative genitourinary   Musculoskeletal negative musculoskeletal ROS (+)   Abdominal   Peds  Hematology negative hematology ROS (+)   Anesthesia Other Findings   Reproductive/Obstetrics                            Anesthesia Physical Anesthesia Plan  ASA: IV  Anesthesia Plan: General   Post-op Pain Management:    Induction: Intravenous  PONV Risk Score and Plan: 3 and Ondansetron, Dexamethasone and Midazolam  Airway Management Planned: Oral ETT and Video Laryngoscope Planned  Additional Equipment:   Intra-op Plan:   Post-operative Plan: Extubation in OR  Informed Consent: I have reviewed the patients History and Physical, chart, labs and discussed the procedure including the risks, benefits and alternatives for the proposed anesthesia with the patient or authorized representative who has indicated his/her understanding and acceptance.     Dental advisory given  Plan Discussed with: CRNA  Anesthesia Plan Comments:        Anesthesia Quick Evaluation

## 2019-04-27 NOTE — Anesthesia Procedure Notes (Signed)
Procedure Name: Intubation Date/Time: 04/27/2019 10:41 AM Performed by: Sharlette Dense, CRNA Patient Re-evaluated:Patient Re-evaluated prior to induction Oxygen Delivery Method: Circle system utilized Preoxygenation: Pre-oxygenation with 100% oxygen Induction Type: IV induction and Rapid sequence Laryngoscope Size: Miller and 2 Grade View: Grade I Tube type: Oral Tube size: 7.5 mm Number of attempts: 1 Airway Equipment and Method: Stylet and Patient positioned with wedge pillow Placement Confirmation: ETT inserted through vocal cords under direct vision,  positive ETCO2 and breath sounds checked- equal and bilateral Secured at: 20 cm Tube secured with: Tape Dental Injury: Teeth and Oropharynx as per pre-operative assessment

## 2019-04-27 NOTE — Discharge Instructions (Signed)
° ° ° °GASTRIC BYPASS/SLEEVE ° Home Care Instructions ° ° These instructions are to help you care for yourself when you go home. ° °Call: If you have any problems. °• Call [phone] and ask for the surgeon on call °• If you need immediate help, come to the ER at [facility].  °• Tell the ER staff that you are a new post-op gastric bypass or gastric sleeve patient °  °Signs and symptoms to report: • Severe vomiting or nausea °o If you cannot keep down clear liquids for longer than 1 day, call your surgeon  °• Abdominal pain that does not get better after taking your pain medication °• Fever over 100.4° F with chills °• Heart beating over 100 beats a minute °• Shortness of breath at rest °• Chest pain °•  Redness, swelling, drainage, or foul odor at incision (surgical) sites °•  If your incisions open or pull apart °• Swelling or pain in calf (lower leg) °• Diarrhea (Loose bowel movements that happen often), frequent watery, uncontrolled bowel movements °• Constipation, (no bowel movements for 3 days) if this happens: Pick one °o Milk of Magnesia, 2 tablespoons by mouth, 3 times a day for 2 days if needed °o Stop taking Milk of Magnesia once you have a bowel movement °o Call your doctor if constipation continues °Or °o Miralax  (instead of Milk of Magnesia) following the label instructions °o Stop taking Miralax once you have a bowel movement °o Call your doctor if constipation continues °• Anything you think is not normal °  °Normal side effects after surgery: • Unable to sleep at night or unable to focus °• Irritability or moody °• Being tearful (crying) or depressed °These are common complaints, possibly related to your anesthesia medications that put you to sleep, stress of surgery, and change in lifestyle.  This usually goes away a few weeks after surgery.  If these feelings continue, call your primary care doctor. °  °Wound Care: You may have surgical glue, steri-strips, or staples over your incisions after  surgery °• Surgical glue:  Looks like a clear film over your incisions and will wear off a little at a time °• Steri-strips: Strips of tape over your incisions. You may notice a yellowish color on the skin under the steri-strips. This is used to make the   steri-strips stick better. Do not pull the steri-strips off - let them fall off °• Staples: Staples may be removed before you leave the hospital °o If you go home with staples, call [facility], [phone] at for an appointment with your surgeon’s nurse to have staples removed 10 days after surgery. °• Showering: You may shower two (2) days after your surgery unless your surgeon tells you differently °o Wash gently around incisions with warm soapy water, rinse well, and gently pat dry  °o No tub baths until staples are removed, steri-strips fall off or glue is gone.  °  °Medications: • Medications should be liquid or crushed if larger than the size of a dime °• Extended release pills (medication that release a little bit at a time through the day) should NOT be crushed or cut. (examples include XL, ER, DR, SR) °• Depending on the size and number of medications you take, you may need to space (take a few throughout the day)/change the time you take your medications so that you do not over-fill your pouch (smaller stomach) °• Make sure you follow-up with your primary care doctor to   make medication changes needed during rapid weight loss and life-style changes °• If you have diabetes, follow up with the doctor that orders your diabetes medication(s) within one week after surgery and check your blood sugar regularly. °• Do not drive while taking prescription pain medication  °• It is ok to take Tylenol by the bottle instructions with your pain medicine or instead of your pain medicine as needed.  DO NOT TAKE NSAIDS (EXAMPLES OF NSAIDS:  IBUPROFREN/ NAPROXEN)  °Diet:                    First 2 Weeks ° You will see the dietician t about two (2) weeks  after your surgery. The dietician will increase the types of foods you can eat if you are handling liquids well: °• If you have severe vomiting or nausea and cannot keep down clear liquids lasting longer than 1 day, call your surgeon @ ([phone]) °Protein Shake °• Drink at least 2 ounces of shake 5-6 times per day °• Each serving of protein shakes (usually 8 - 12 ounces) should have: °o 15 grams of protein  °o And no more than 5 grams of carbohydrate  °• Goal for protein each day: °o Men = 80 grams per day °o Women = 60 grams per day °• Protein powder may be added to fluids such as non-fat milk or Lactaid milk or unsweetened Soy/Almond milk (limit to 35 grams added protein powder per serving) ° °Hydration °• Slowly increase the amount of water and other clear liquids as tolerated (See Acceptable Fluids) °• Slowly increase the amount of protein shake as tolerated  °•  Sip fluids slowly and throughout the day.  Do not use straws. °• May use sugar substitutes in small amounts (no more than 6 - 8 packets per day; i.e. Splenda) ° °Fluid Goal °• The first goal is to drink at least 8 ounces of protein shake/drink per day (or as directed by the nutritionist); some examples of protein shakes are Syntrax Nectar, Adkins Advantage, EAS Edge HP, and Unjury. See handout from pre-op Bariatric Education Class: °o Slowly increase the amount of protein shake you drink as tolerated °o You may find it easier to slowly sip shakes throughout the day °o It is important to get your proteins in first °• Your fluid goal is to drink 64 - 100 ounces of fluid daily °o It may take a few weeks to build up to this °• 32 oz (or more) should be clear liquids  °And  °• 32 oz (or more) should be full liquids (see below for examples) °• Liquids should not contain sugar, caffeine, or carbonation ° °Clear Liquids: °• Water or Sugar-free flavored water (i.e. Fruit H2O, Propel) °• Decaffeinated coffee or tea (sugar-free) °• Crystal Lite, Wyler’s Lite,  Minute Maid Lite °• Sugar-free Jell-O °• Bouillon or broth °• Sugar-free Popsicle:   *Less than 20 calories each; Limit 1 per day ° °Full Liquids: °Protein Shakes/Drinks + 2 choices per day of other full liquids °• Full liquids must be: °o No More Than 15 grams of Carbs per serving  °o No More Than 3 grams of Fat per serving °• Strained low-fat cream soup (except Cream of Potato or Tomato) °• Non-Fat milk °• Fat-free Lactaid Milk °• Unsweetened Soy Or Unsweetened Almond Milk °• Low Sugar yogurt (Dannon Lite & Fit, Greek yogurt; Oikos Triple Zero; Chobani Simply 100; Yoplait 100 calorie Greek - No Fruit on the Bottom) ° °  °Vitamins   and Minerals • Start 1 day after surgery unless otherwise directed by your surgeon °• 2 Chewable Bariatric Specific Multivitamin / Multimineral Supplement with iron (Example: Bariatric Advantage Multi EA) °• Chewable Calcium with Vitamin D-3 °(Example: 3 Chewable Calcium Plus 600 with Vitamin D-3) °o Take 500 mg three (3) times a day for a total of 1500 mg each day °o Do not take all 3 doses of calcium at one time as it may cause constipation, and you can only absorb 500 mg  at a time  °o Do not mix multivitamins containing iron with calcium supplements; take 2 hours apart °• Menstruating women and those with a history of anemia (a blood disease that causes weakness) may need extra iron °o Talk with your doctor to see if you need more iron °• Do not stop taking or change any vitamins or minerals until you talk to your dietitian or surgeon °• Your Dietitian and/or surgeon must approve all vitamin and mineral supplements °  °Activity and Exercise: Limit your physical activity as instructed by your doctor.  It is important to continue walking at home.  During this time, use these guidelines: °• Do not lift anything greater than ten (10) pounds for at least two (2) weeks °• Do not go back to work or drive until your surgeon says you can °• You may have sex when you feel comfortable  °o It is  VERY important for female patients to use a reliable birth control method; fertility often increases after surgery  °o All hormonal birth control will be ineffective for 30 days after surgery due to medications given during surgery a barrier method must be used. °o Do not get pregnant for at least 18 months °• Start exercising as soon as your doctor tells you that you can °o Make sure your doctor approves any physical activity °• Start with a simple walking program °• Walk 5-15 minutes each day, 7 days per week.  °• Slowly increase until you are walking 30-45 minutes per day °Consider joining our BELT program. [phone] or email [email or web address].[email or web address] °  °Special Instructions Things to remember: °• Use your CPAP when sleeping if this applies to you ° °• [facility] has two free Bariatric Surgery Support Groups that meet monthly °o The 3rd Thursday of each month, 6 pm, [facility]  °o The 2nd Friday of each month, 11:45 am in the private dining room in the basement of [facility] °• It is very important to keep all follow up appointments with your surgeon, dietitian, primary care physician, and behavioral health practitioner °• Routine follow up schedule with your surgeon include appointments at 2-3 weeks, 6-8 weeks, 6 months, and 1 year at a minimum.  Your surgeon may request to see you more often.   °o After the first year, please follow up with your bariatric surgeon and dietitian at least once a year in order to maintain best weight loss results °[facility]: [phone] °[facility]: [phone] °Bariatric Nurse Coordinator: [phone] °  °   Reviewed and Endorsed  °by [facility] Patient Education Committee, June, 2016 °Edits Approved: Aug, 2018 ° ° ° °

## 2019-04-27 NOTE — Interval H&P Note (Signed)
History and Physical Interval Note:  04/27/2019 9:54 AM  Kelsey Henson  has presented today for surgery, with the diagnosis of MORBID OBESITY.  The various methods of treatment have been discussed with the patient and family. After consideration of risks, benefits and other options for treatment, the patient has consented to  Procedure(s): LAPAROSCOPIC ROUX-EN-Y GASTRIC BYPASS WITH UPPER ENDOSCOPY, ERAS Pathway (N/A) as a surgical intervention.  The patient's history has been reviewed, patient examined, no change in status, stable for surgery.  I have reviewed the patient's chart and labs.  Questions were answered to the patient's satisfaction.     Chelsea Rich Brave

## 2019-04-27 NOTE — Progress Notes (Signed)
Pt demonstrated successful use of incentive spirometer reaching up to 1750. Pt ambulated to bathroom voided 200cc's and then ambulated back to chair. Patient tolerated well.

## 2019-04-28 ENCOUNTER — Encounter (HOSPITAL_COMMUNITY): Payer: Self-pay | Admitting: Surgery

## 2019-04-28 LAB — COMPREHENSIVE METABOLIC PANEL
ALT: 62 U/L — ABNORMAL HIGH (ref 0–44)
AST: 58 U/L — ABNORMAL HIGH (ref 15–41)
Albumin: 3.5 g/dL (ref 3.5–5.0)
Alkaline Phosphatase: 75 U/L (ref 38–126)
Anion gap: 9 (ref 5–15)
BUN: 16 mg/dL (ref 8–23)
CO2: 18 mmol/L — ABNORMAL LOW (ref 22–32)
Calcium: 8.5 mg/dL — ABNORMAL LOW (ref 8.9–10.3)
Chloride: 112 mmol/L — ABNORMAL HIGH (ref 98–111)
Creatinine, Ser: 1.09 mg/dL — ABNORMAL HIGH (ref 0.44–1.00)
GFR calc Af Amer: >60 mL/min (ref >60)
GFR calc non Af Amer: 54 mL/min — ABNORMAL LOW (ref >60)
Glucose, Bld: 125 mg/dL — ABNORMAL HIGH (ref 70–99)
Potassium: 4.5 mmol/L (ref 3.5–5.1)
Sodium: 139 mmol/L (ref 135–145)
Total Bilirubin: 0.6 mg/dL (ref 0.3–1.2)
Total Protein: 6.3 g/dL — ABNORMAL LOW (ref 6.5–8.1)

## 2019-04-28 LAB — CBC WITH DIFFERENTIAL/PLATELET
Abs Immature Granulocytes: 0.04 10*3/uL (ref 0.00–0.07)
Basophils Absolute: 0 10*3/uL (ref 0.0–0.1)
Basophils Relative: 0 %
Eosinophils Absolute: 0 10*3/uL (ref 0.0–0.5)
Eosinophils Relative: 0 %
HCT: 49.8 % — ABNORMAL HIGH (ref 36.0–46.0)
Hemoglobin: 14.8 g/dL (ref 12.0–15.0)
Immature Granulocytes: 0 %
Lymphocytes Relative: 4 %
Lymphs Abs: 0.4 10*3/uL — ABNORMAL LOW (ref 0.7–4.0)
MCH: 30.1 pg (ref 26.0–34.0)
MCHC: 29.7 g/dL — ABNORMAL LOW (ref 30.0–36.0)
MCV: 101.4 fL — ABNORMAL HIGH (ref 80.0–100.0)
Monocytes Absolute: 0.3 10*3/uL (ref 0.1–1.0)
Monocytes Relative: 3 %
Neutro Abs: 8.7 10*3/uL — ABNORMAL HIGH (ref 1.7–7.7)
Neutrophils Relative %: 93 %
Platelets: 177 10*3/uL (ref 150–400)
RBC: 4.91 MIL/uL (ref 3.87–5.11)
RDW: 14.5 % (ref 11.5–15.5)
WBC: 9.5 10*3/uL (ref 4.0–10.5)
nRBC: 0 % (ref 0.0–0.2)

## 2019-04-28 MED ORDER — ENOXAPARIN SODIUM 60 MG/0.6ML ~~LOC~~ SOLN
60.0000 mg | Freq: Two times a day (BID) | SUBCUTANEOUS | Status: DC
  Administered 2019-04-28 – 2019-04-29 (×2): 60 mg via SUBCUTANEOUS
  Filled 2019-04-28 (×2): qty 0.6

## 2019-04-28 NOTE — Progress Notes (Signed)
Nutrition Note  RD consulted for diet education for patient s/p bariatric surgery. While RDs are working remotely, Insurance risk surveyor providing diet education.   If other nutrition issues arise, please consult RD.   Clayton Bibles, MS, RD, [place] Dietitian Pager: [phone] After Hours Pager: [phone]

## 2019-04-28 NOTE — Progress Notes (Signed)
Patient alert and oriented, Post op day 1.  Provided support and encouragement.  Encouraged pulmonary toilet, ambulation and small sips of liquids.  Completed 12 ounces of clear fluid and started protein this morning.  All questions answered.  Will continue to monitor.

## 2019-04-28 NOTE — Progress Notes (Signed)
RN, Jeannine Boga completed Lovenox education with patient.  Lovenox teaching kit provided at that time.  Patient states husband will be giving injections as she does not feel comfortable with self injecting.  Will arrange for husband education at bedside at discharge due to Covid visitor restricitons.  Patient did state understanding for proper technique, signs and symptoms to report, and medication details.  Questions answered.

## 2019-04-28 NOTE — Progress Notes (Addendum)
S: Slept OK. Pain well controlled. Some bleeding from left 2mm trocar site. Tolerating liquids, no nausea but did have one episode of regurgitation into her mouth.   Vitals, labs, intake/output, and orders reviewed at this time. Has not required any narcotics. rec'd one dose of zofran.   Gen: A&Ox3, no distress  H&N: EOMI, atraumatic, neck supple Chest: unlabored respirations, RRR Abd: soft, nontender, nondistended, incisions with steri strips and bandaids, no cellulitis or hematoma. Old blood stainng dressing at left 57mm site.  Ext: warm, no edema, SCDs in place Neuro: grossly normal  Lines/tubes/drains: PIV  A/P:  POD 1 gastric bypass, doing well -Starting protein shakes this AM -Ambulate, IS -Lovenox+SCDs -Will need lovenox on DC, 30 day risk of VTE risk calculated at 0.52%. Will increase lovenox dose for tonight to the recommended 60mg  q12h she will need at home, recheck CBC tomorrow before [place], MD [facility], [place] Pager [phone]

## 2019-04-29 LAB — CBC WITH DIFFERENTIAL/PLATELET
Abs Immature Granulocytes: 0.04 10*3/uL (ref 0.00–0.07)
Basophils Absolute: 0.1 10*3/uL (ref 0.0–0.1)
Basophils Relative: 1 %
Eosinophils Absolute: 0 10*3/uL (ref 0.0–0.5)
Eosinophils Relative: 0 %
HCT: 39.8 % (ref 36.0–46.0)
Hemoglobin: 12.4 g/dL (ref 12.0–15.0)
Immature Granulocytes: 0 %
Lymphocytes Relative: 17 %
Lymphs Abs: 1.6 10*3/uL (ref 0.7–4.0)
MCH: 29.6 pg (ref 26.0–34.0)
MCHC: 31.2 g/dL (ref 30.0–36.0)
MCV: 95 fL (ref 80.0–100.0)
Monocytes Absolute: 0.7 10*3/uL (ref 0.1–1.0)
Monocytes Relative: 7 %
Neutro Abs: 7.2 10*3/uL (ref 1.7–7.7)
Neutrophils Relative %: 75 %
Platelets: 154 10*3/uL (ref 150–400)
RBC: 4.19 MIL/uL (ref 3.87–5.11)
RDW: 14.8 % (ref 11.5–15.5)
WBC: 9.7 10*3/uL (ref 4.0–10.5)
nRBC: 0 % (ref 0.0–0.2)

## 2019-04-29 LAB — HEMOGLOBIN AND HEMATOCRIT, BLOOD
HCT: 42.7 % (ref 36.0–46.0)
Hemoglobin: 13.9 g/dL (ref 12.0–15.0)

## 2019-04-29 MED ORDER — ENOXAPARIN SODIUM 60 MG/0.6ML ~~LOC~~ SOLN: 60.0000 mg | Freq: Two times a day (BID) | SUBCUTANEOUS | 0 refills | Status: AC

## 2019-04-29 MED ORDER — ONDANSETRON 4 MG PO TBDP: 4.0000 mg | ORAL_TABLET | Freq: Four times a day (QID) | ORAL | 0 refills | Status: DC | PRN

## 2019-04-29 MED ORDER — GABAPENTIN 100 MG PO CAPS: 200.0000 mg | ORAL_CAPSULE | Freq: Two times a day (BID) | ORAL | 0 refills | Status: DC

## 2019-04-29 MED ORDER — PANTOPRAZOLE SODIUM 40 MG PO TBEC: 40.0000 mg | DELAYED_RELEASE_TABLET | Freq: Every day | ORAL | 0 refills | Status: DC

## 2019-04-29 MED ORDER — TRAMADOL HCL 50 MG PO TABS: 50.0000 mg | ORAL_TABLET | Freq: Four times a day (QID) | ORAL | 0 refills | Status: DC | PRN

## 2019-04-29 MED ORDER — ACETAMINOPHEN 500 MG PO TABS: 1000.0000 mg | ORAL_TABLET | Freq: Three times a day (TID) | ORAL | 0 refills | Status: AC

## 2019-04-29 NOTE — Progress Notes (Signed)
Patient alert and oriented, pain is controlled. Patient is tolerating fluids, advanced to protein shake today, patient is tolerating well. Reviewed Gastric Bypass discharge instructions with patient and spouse>  Patient and spouse is able to articulate understanding. Provided information on BELT program, Support Group, BSTOP, and [facility] outpatient pharmacy.  Lovenox education provided to spouse and patient.   All questions answered, will continue to monitor.   Total fluid intake 1020 Per dehydration protocol call back one week post op

## 2019-04-29 NOTE — Discharge Summary (Signed)
Physician Discharge Summary  Kelsey Henson ZOX:[id number] DOB: [birth year] DOA: 04/27/2019  PCP: [facility], [place] date: 04/27/2019 Discharge date: 04/29/2019   Recommendations for Outpatient Follow-up:    Follow-up Information    [name], MD. Go on 05/19/2019.   Specialty: General Surgery Why: at 920 Contact information: [address] [place] [address] [phone]        [name], MD .   Specialty: General Surgery Contact information: [address] [place] [address] [phone]          Discharge Diagnoses:  Active Problems:   Morbid obesity [facility])   Surgical Procedure: Laparoscopic Roux-en-Y gastric bypass, upper endoscopy  Discharge Condition: Good Disposition: Home  Diet recommendation: Postoperative gastric bypass diet  Filed Weights   04/27/19 0809  Weight: (!) 180.2 kg     Hospital Course:  The patient was admitted for a planned laparoscopic Roux-en-Y gastric bypass. Please see operative note. Preoperatively the patient was given lovenox for DVT prophylaxis. ERAS protocol was used. Postoperative prophylactic Lovenox dosing was started on the morning of postoperative day 1.  The patient was started on ice chips and water on the evening of POD 0 which they tolerated. On postoperative day 1 The patient's diet was advanced to protein shakes which they also tolerated. On POD 2, The patient was ambulating without difficulty. Their vital signs are stable without fever or tachycardia. Their hemoglobin had decreased but was rechecked and stable/slightly increased prior to discharge. The patient was maintained on their home settings for CPAP therapy. The patient had received discharge instructions and counseling. They were deemed stable for discharge.  BP 126/68 (BP Location: Right Arm)   Pulse 61   Temp 97.6 F (36.4 C) (Oral)   Resp 16   Ht 5\' 6"  (1.676 m)   Wt (!) 180.2 kg    SpO2 99%   BMI 64.11 kg/m   Gen: alert, NAD, non-toxic appearing Pupils: equal, no scleral icterus Pulm: Lungs clear to auscultation, symmetric chest rise CV: regular rate and rhythm Abd: soft, min tender, nondistended. No cellulitis. No incisional hernia Ext: no edema, no calf tenderness Skin: no rash, no jaundice  Discharge Instructions  Discharge Instructions    Ambulate hourly while awake   Complete by: As directed    Call MD for:  difficulty breathing, headache or visual disturbances   Complete by: As directed    Call MD for:  persistant dizziness or light-headedness   Complete by: As directed    Call MD for:  persistant nausea and vomiting   Complete by: As directed    Call MD for:  redness, tenderness, or signs of infection (pain, swelling, redness, odor or green/yellow discharge around incision site)   Complete by: As directed    Call MD for:  severe uncontrolled pain   Complete by: As directed    Call MD for:  temperature >101 F   Complete by: As directed    Incentive spirometry   Complete by: As directed    Perform hourly while awake     Allergies as of 04/29/2019   No Known Allergies     Medication List    STOP taking these medications   omeprazole 20 MG capsule Commonly known as: PRILOSEC     TAKE these medications   acetaminophen 500 MG tablet Commonly known as: TYLENOL Take 2 tablets (1,000 mg total) by mouth every 8 (eight) hours for 5 days. After this,  take as needed as first line for pain or headache.   enoxaparin 60 MG/0.6ML injection Commonly known as: LOVENOX Inject 0.6 mLs (60 mg total) into the skin every 12 (twelve) hours for 14 days.   gabapentin 100 MG capsule Commonly known as: NEURONTIN Take 2 capsules (200 mg total) by mouth every 12 (twelve) hours.   lisinopril 2.5 MG tablet Commonly known as: ZESTRIL Take 2.5 mg by mouth daily. Notes to patient: Monitor Blood Pressure Daily and keep a log for primary care physician. If your  blood pressure is less than 120/80, do not take this medication and let your primary care doctor know.  You may need to make changes to your medications with rapid weight loss.     metoprolol tartrate 25 MG tablet Commonly known as: LOPRESSOR Take 25 mg by mouth 2 (two) times daily. Notes to patient: Monitor Blood Pressure Daily and keep a log for primary care physician.  You may need to make changes to your medications with rapid weight loss.     ondansetron 4 MG disintegrating tablet Commonly known as: ZOFRAN-ODT Take 1 tablet (4 mg total) by mouth every 6 (six) hours as needed for nausea or vomiting.   pantoprazole 40 MG tablet Commonly known as: PROTONIX Take 1 tablet (40 mg total) by mouth daily. Take this EVERY DAY, even if you are not having heartburn symptoms.   pravastatin 40 MG tablet Commonly known as: PRAVACHOL Take 40 mg by mouth at bedtime.   traMADol 50 MG tablet Commonly known as: ULTRAM Take 1 tablet (50 mg total) by mouth every 6 (six) hours as needed for severe pain (Take this medication for pain not relieved by tylenol/ gabapentin.).   vitamin C 500 MG tablet Commonly known as: ASCORBIC ACID Take 500 mg by mouth daily.   Vitamin D 50 MCG (2000 UT) tablet Take 2,000 Units by mouth daily.      Follow-up Information    [name], MD. Go on 05/19/2019.   Specialty: General Surgery Why: at 920 Contact information: [address] [place] [address] [phone]        [name], MD .   Specialty: General Surgery Contact information: [address] [place] [address] [phone]            The results of significant diagnostics from this hospitalization (including imaging, microbiology, ancillary and laboratory) are listed below for reference.    Significant Diagnostic Studies: No results found.  Labs: Basic Metabolic Panel: Recent Labs  Lab 04/28/19 0331  NA 139  K 4.5  CL  112*  CO2 18*  GLUCOSE 125*  BUN 16  CREATININE 1.09*  CALCIUM 8.5*   Liver Function Tests: Recent Labs  Lab 04/28/19 0331  AST 58*  ALT 62*  ALKPHOS 75  BILITOT 0.6  PROT 6.3*  ALBUMIN 3.5    CBC: Recent Labs  Lab 04/28/19 0331 04/29/19 0416 04/29/19 1224  WBC 9.5 9.7  --   NEUTROABS 8.7* 7.2  --   HGB 14.8 12.4 13.9  HCT 49.8* 39.8 42.7  MCV 101.4* 95.0  --   PLT 177 154  --     CBG: No results for input(s): GLUCAP in the last 168 hours.  Active Problems:   Morbid obesity ([place])    Signed:  [name], [facility], [place] 04/30/2019, 8:19 AM

## 2019-04-29 NOTE — Progress Notes (Signed)
S: No acute issues. Had burning epigastric pain last night around 10pm. Passing flatus. Walking. Minimal surgical pain.   Vitals, labs, intake/output, and orders reviewed at this time. Afebrile, no tachycardia (on beta blocker), BP mildly elevated. Sats 99%. PO 780, UOP 800. Has not required any narcotics. Has received 0 prn meds for pain or nausea last 24h+  Gen: A&Ox3, no distress  H&N: EOMI, atraumatic, neck supple Chest: unlabored respirations, RRR Abd: soft, nontender, nondistended, incisions with steri strips and bandaids, no cellulitis or hematoma, mild bruising. Old blood stainng dressing at left 28mm site.  Ext: warm, no edema, SCDs in place Neuro: grossly normal  Lines/tubes/drains: PIV  A/P:  POD 2 gastric bypass, doing well -Continue clears/ protein shakes -Ambulate, IS -Lovenox+SCDs -Will need lovenox on DC, 30 day risk of VTE risk calculated at 0.52%. Dose last night increased to the recommended 60mg  q12h she will need at home, hgb decreased from 14.8 to 12.4. Clinically no evidence of bleeding, suspect this is mostly dilutional. Will recheck hgb at 12pm, if stable will plan DC this afternoon.    Romana Juniper, MD [facility], [place] Pager [phone]

## 2019-04-29 NOTE — Progress Notes (Signed)
Pt hemoglobin on 12 pm redraw is 13.9.  Dr Kae Heller made aware.  Discharge order if patient hemoglobin stable.  Made patient aware to coordinate education with spouse for Lovenox and bariatric discharge education

## 2019-04-29 NOTE — TOC Benefit Eligibility Note (Signed)
Transition of Care [facility]) Benefit Eligibility Note    Patient Details  Name: Kelsey Henson MRN: [id number] Date of Birth: August 07, 1956   Medication/Dose: Lovenox/ Enoxaparin 54m sq q12 x 2 wks  Covered?: Yes  Tier: 3 Drug  Prescription Coverage Preferred Pharmacy: local  Spoke with Person/Company/Phone Number:: Shirley/ [employer] [facility] [phone] Co-Pay: 0  Prior Approval: No  Deductible: Met       FKerin SalenPhone Number: 04/29/2019, 10:08 AM

## 2019-04-29 NOTE — Plan of Care (Signed)
Plan of care reviewed and discussed with the patient. 

## 2019-04-29 NOTE — Progress Notes (Signed)
Discharge instructions given to pt and all questions were answered.  

## 2019-05-03 ENCOUNTER — Telehealth (HOSPITAL_COMMUNITY): Payer: Self-pay

## 2019-05-03 NOTE — Telephone Encounter (Signed)
Patient called to discuss post bariatric surgery follow up questions.  See below:   1.  Tell me about your pain and pain management?denies pain has not needed anything other than gabapentin and tylenol  2.  Let's talk about fluid intake.  How much total fluid are you taking in?55-60  3.  How much protein have you taken in the last 2 days?60 grams getting tired of sweet taste discussed buying unflavored protein to add to soups and broths  4.  Have you had nausea?  Tell me about when have experienced nausea and what you did to help?denies  5.  Has the frequency or color changed with your urine?no problems  6.  Tell me what your incisions look like?bruised abdomen no drainage. Steri strips starting to come off  7.  Have you been passing gas? BM?passing gas had bm  8.  If a problem or question were to arise who would you call?  Do you know contact numbers for [place], [facility], and [facility]?aware of how to contact all services  9.  How has the walking going?walking hourly, walked outside last night to husband shop  10.  How are your vitamins and calcium going?  How are you taking them?started Mvi and Ca, able to articulate how to take them correctly  Reminded of  [facility] appointment on 8/11.

## 2019-05-11 ENCOUNTER — Other Ambulatory Visit: Payer: Self-pay

## 2019-05-11 ENCOUNTER — Encounter: Payer: BC Managed Care – PPO | Attending: General Surgery | Admitting: Skilled Nursing Facility1

## 2019-05-11 DIAGNOSIS — E669 Obesity, unspecified: Secondary | ICD-10-CM | POA: Insufficient documentation

## 2019-05-12 NOTE — Progress Notes (Signed)
2 Week Post-Operative Nutrition Class   Patient was seen on 11/24/18 for Post-Operative Nutrition education at the [facility].    Surgery date: 04/27/2019 Surgery type: RYGB Start weight at [facility]: 419.4 Weight today: 384.2   Body Composition Scale 05/11/2019  Total Body Fat % 54.4  Visceral Fat 33  Fat-Free Mass % 45.5   Total Body Water % 37.2   Muscle-Mass lbs 31.6  Body Fat Displacement          Torso  lbs 129.9         Left Leg  lbs 25.9         Right Leg  lbs 25.9         Left Arm  lbs 12.9         Right Arm   lbs 12.9     The following the learning objectives were met by the patient during this course:  Identifies Phase 3 (Soft, High Proteins) Dietary Goals and will begin from 2 weeks post-operatively to 2 months post-operatively  Identifies appropriate sources of fluids and proteins   States protein recommendations and appropriate sources post-operatively  Identifies the need for appropriate texture modifications, mastication, and bite sizes when consuming solids  Identifies appropriate multivitamin and calcium sources post-operatively  Describes the need for physical activity post-operatively and will follow MD recommendations  States when to call healthcare provider regarding medication questions or post-operative complications   Handouts given during class include:  Phase 3A: Soft, High Protein Diet Handout   Follow-Up Plan: Patient will follow-up at [facility] in 6 weeks for 2 month post-op nutrition visit for diet advancement per MD.

## 2019-05-17 ENCOUNTER — Telehealth: Payer: Self-pay | Admitting: Skilled Nursing Facility1

## 2019-05-17 NOTE — Telephone Encounter (Signed)
RD called pt to verify fluid intake once starting soft, solid proteins 2 week post-bariatric surgery.   Daily Fluid intake: 64+ Daily Protein intake: 60+  Concerns/issues:   Pt states she is having cravings for sweet foods.

## 2019-05-19 DIAGNOSIS — H524 Presbyopia: Secondary | ICD-10-CM | POA: Diagnosis not present

## 2019-05-19 DIAGNOSIS — H52223 Regular astigmatism, bilateral: Secondary | ICD-10-CM | POA: Diagnosis not present

## 2019-06-22 ENCOUNTER — Encounter: Payer: BC Managed Care – PPO | Attending: General Surgery | Admitting: Dietician

## 2019-06-22 ENCOUNTER — Other Ambulatory Visit: Payer: Self-pay

## 2019-06-22 DIAGNOSIS — E669 Obesity, unspecified: Secondary | ICD-10-CM | POA: Diagnosis not present

## 2019-06-22 NOTE — Patient Instructions (Signed)
.   Continue to aim for a minimum of 64 fluid ounces daily with at least 30 ounces being plain water . Eat non-starchy vegetables 2 times a day 7 days a week . Start out with soft cooked vegetables today and tomorrow; if tolerated, begin to eat raw vegetables including salads . Per meal/snack, eat 3 ounces of protein first then start on non-starchy vegetables o Once you understand how much of your meal leads to satisfaction (not full) while still eating 3 ounces of protein and non-starchy vegetables, you can eat them in any order (figure out how much you can eat at a time to get enough but not too much)  . Continue to aim for 30 minutes of physical activity at least 5 times a week

## 2019-06-22 NOTE — Progress Notes (Signed)
Bariatric Follow-Up Visit Medical Nutrition Therapy  Appt Start Time: 7:30am  End Time: 8:15am  2 Months Post-Operative RYGB Surgery Surgery Date: 04/27/2019  Pt's Expectations of Surgery/ Goals: to lose weight   Pt's husband attended the appointment today as well, is very supportive and does the food shopping and most of the cooking in the home. Pt states she only has 1 kidney and is concerned if the calcium recommendation (1,500 mg/day) is too much for her.    NUTRITION ASSESSMENT  Anthropometrics  Start weight at [facility]: 419.4 lbs (11/24/2018) Today's weight: 362.7 lbs Total weight lost: -56.7 lbs  Body Composition Scale 05/11/2019 06/22/2019  Weight  lbs 384.2 362.7  BMI 63.9 60  Total Body Fat  % 54.4 53.5     Visceral Fat 33 31  Fat-Free Mass  % 45.5 46.4     Total Body Water  % 37.2 37.7     Muscle-Mass  lbs 31.6 31.4  Body Fat Displacement  ---         Torso  lbs 129.9 120.7         Left Leg  lbs 25.9 24.1         Right Leg  lbs 25.9 24.1         Left Arm  lbs 12.9 12         Right Arm  lbs 12.9 12   Clinical Medical Hx: obesity, uterine cancer, kidney cancer, GERD, migraines, sleep apnea Medications: Lisinopril, metoprolol, omeprazole, pravastatin  24-Hr Dietary Recall First Meal: 1/2 egg + 1 slice toast + 1/2 slice bacon  Snack: skips   Second Meal: lentil soup  Snack: edamame beans (or peanut butter toast)   Third Meal: 2 oz seafood  Snack: none stated  Beverages: water, protein shake, Crystal Light   Food & Nutrition Related Hx Dietary Hx: Protein foods eaten include egg, scallops, crab legs, fish, steak, shrimp, edamame, lentil soup, Greek yogurt. Pt states she also has toast some mornings for breakfast or as a snack. Only 1 episode of vomiting where she took a sip of water while eating. States she has not tracked food intake since the liquid diet because it is hard to track protein foods, but states she does weigh her meals out to 2 or 2.5 oz at a time. Eats  out occasionally, but will usually order grilled chicken. States it is difficult to reach fluid goal per day because she has to sip all day long. Uses a 32-oz Yeti her daughter gave her, but states she can only get through 1 per day.  Supplements: celebrate MVI, calcium  Estimated Daily Fluid Intake: 32 oz Estimated Daily Protein Intake: 40 g  Physical Activity  Current average weekly physical activity: swimming, elliptical    Post-Op Goals Using straws: no  Drinking while eating: no  Chewing/swallowing difficulties: no Changes in vision: no Changes to mood/headaches: no Hair loss/changes to skin/nails: no Difficulty focusing/concentrating: no Sweating: no Dizziness/lightheadedness: no Palpitations: no  Carbonated/caffeinated beverages: yes  N/V/D/C/Gas: nausea (once when drank while eating); constipation occasionally  Abdominal pain: no Dumping syndrome: no   NUTRITION DIAGNOSIS  Overweight/obesity ([place]-3.3) related to past poor dietary habits and physical inactivity as evidenced by patient w/ completed RYGB surgery following dietary guidelines for continued weight loss.   NUTRITION INTERVENTION Nutrition counseling (C-1) and education (E-2) to facilitate bariatric surgery goals, including: . Diet advancement to the next phase (phase IV) now including non-starchy vegetables o I stressed the importance of adequate protein and  to not sacrifice protein foods for veggies. We discussed ways to consume more protein throughout the day in order to meet her goals.  . The importance of consuming adequate calories as well as certain nutrients daily due to the body's need for essential vitamins, minerals, and fats . The importance of daily physical activity and to reach a goal of at least 150 minutes of moderate to vigorous physical activity weekly (or as directed by their physician) due to benefits such as increased musculature and improved lab values  Handouts Provided Include   Phase IV:  Protein + Non-Starchy Vegetables   Learning Style & Readiness for Change Teaching method utilized: Visual & Auditory  Demonstrated degree of understanding via: Teach Back  Barriers to learning/adherence to lifestyle change: None Identified    MONITORING & EVALUATION Dietary intake, weekly physical activity, body weight, and goals in 4 months.  Next Steps Patient is to follow-up in 4 months for 6 Month Bariatric Class.

## 2019-07-07 DIAGNOSIS — Z6841 Body Mass Index (BMI) 40.0 and over, adult: Secondary | ICD-10-CM | POA: Diagnosis not present

## 2019-07-07 DIAGNOSIS — Z1331 Encounter for screening for depression: Secondary | ICD-10-CM | POA: Diagnosis not present

## 2019-07-07 DIAGNOSIS — I10 Essential (primary) hypertension: Secondary | ICD-10-CM | POA: Diagnosis not present

## 2019-07-07 DIAGNOSIS — Z9884 Bariatric surgery status: Secondary | ICD-10-CM | POA: Diagnosis not present

## 2019-07-07 DIAGNOSIS — Z23 Encounter for immunization: Secondary | ICD-10-CM | POA: Diagnosis not present

## 2019-07-08 ENCOUNTER — Encounter: Payer: Self-pay | Admitting: Internal Medicine

## 2019-07-26 ENCOUNTER — Ambulatory Visit (INDEPENDENT_AMBULATORY_CARE_PROVIDER_SITE_OTHER): Payer: BC Managed Care – PPO | Admitting: Internal Medicine

## 2019-07-26 ENCOUNTER — Encounter: Payer: Self-pay | Admitting: Internal Medicine

## 2019-07-26 VITALS — BP 118/88 | HR 80 | Temp 98.5°F | Ht 65.0 in | Wt 354.2 lb

## 2019-07-26 DIAGNOSIS — Z905 Acquired absence of kidney: Secondary | ICD-10-CM | POA: Diagnosis not present

## 2019-07-26 DIAGNOSIS — Z86010 Personal history of colonic polyps: Secondary | ICD-10-CM | POA: Diagnosis not present

## 2019-07-26 DIAGNOSIS — Z8542 Personal history of malignant neoplasm of other parts of uterus: Secondary | ICD-10-CM | POA: Diagnosis not present

## 2019-07-26 DIAGNOSIS — Z860101 Personal history of adenomatous and serrated colon polyps: Secondary | ICD-10-CM

## 2019-07-26 DIAGNOSIS — R6881 Early satiety: Secondary | ICD-10-CM | POA: Diagnosis not present

## 2019-07-26 DIAGNOSIS — K219 Gastro-esophageal reflux disease without esophagitis: Secondary | ICD-10-CM

## 2019-07-26 MED ORDER — PREPOPIK 10-3.5-12 MG-GM-GM PO PACK: 1.0000 | PACK | Freq: Once | ORAL | 0 refills | Status: AC

## 2019-07-26 NOTE — H&P (View-Only) (Signed)
Kelsey Henson 63 y.o. 06-Oct-1955 [id number] Referred by: Practice, [place]:   Encounter Diagnoses  Name Primary?  [name] Hx of adenomatous colonic polyps Yes  . History of uterine cancer   . Early satiety after gastric bypass   . Single kidney   . Gastroesophageal reflux disease, unspecified whether esophagitis present     Hx of adenomatous colonic polyps She had a "large tubulovillous adenoma" removed from the rectum.  I think this was in 2013 and then she had a heme positive stool and had a diminutive adenoma and a hyperplastic polyp on colonoscopy in 2015. It is reasonable to repeat a colonoscopy at this time.  The history of uterine cancer and kidney cancer and father with prostate cancer does raise some question of genetic syndrome.  She will be referred next for evaluation, she declined testing in the past because of cost, when she was in [place], [place].  Will use Prep-o-Pik given the early satiety issue status post gastric bypass.  Procedure needs to be done at the hospital given morbid obesity and BMI of 58.95.  Regarding GERD, she is on pantoprazole and that is probably not being absorbed well post gastric bypass.  When I see her for her colonoscopy will discuss changing to something with a capsule and granules.   I appreciate the opportunity to care for this patient.  CC: Practice, [facility]  Dr. [name]  Subjective:   Chief Complaint: History of colon polyps need for colonoscopy, difficulty tolerating prep  HPI The patient is a 63 year old married white woman with a history of a tubulovillous adenoma of the rectum around 2013 I think and then a subsequent colonoscopy for heme positive stool with a hyperplastic polyp at 80 cm and a diminutive adenoma in the rectum removed.  She was recommended to have a repeat exam in 5 years.  She had laparoscopic Roux-en-Y gastric bypass surgery and has lost 60 pounds or so so far but still  has a lot of difficulty with pain, anorexia and early satiety.  She is concerned about being able to tolerate No Known Allergies Current Meds  Medication Sig  . Calcium-Vitamin D-Vitamin K ([employer] CALCIUM SOFT CHEWS) 650-12.5-40 MG-MCG-MCG CHEW Chew by mouth.  . metoprolol tartrate (LOPRESSOR) 25 MG tablet Take 25 mg by mouth 2 (two) times daily.  . Multiple Vitamin (MULTIVITAMIN) tablet Take 1 tablet by mouth daily.  . pantoprazole (PROTONIX) 40 MG tablet Take 1 tablet (40 mg total) by mouth daily. Take this EVERY DAY, even if you are not having heartburn symptoms.  . pravastatin (PRAVACHOL) 40 MG tablet Take 40 mg by mouth at bedtime.   Past Medical History:  Diagnosis Date  . Arthritis   . Atrial fibrillation (HCC)    Transient, isolated  . Dyslipidemia   . Gastric bypass status for obesity 04/27/2019  . GERD (gastroesophageal reflux disease)   . Hx of adenomatous colonic polyps 2013  . Hypertension   . Migraines   . Morbid obesity ([place])   . Renal cancer ([place]) 2018   rt nephrectomy  . Single kidney   . Sleep apnea    wears CPAP  . Uterine cancer [facility]) 2014   hysterectomy   Past Surgical History:  Procedure Laterality Date  . ABDOMINAL HYSTERECTOMY     Due to Cancer, complete hyst  . BUNIONECTOMY     left foot  . CHOLECYSTECTOMY    . COLONOSCOPY    . FRACTURE SURGERY Right  Arm repaired with plate but plate is now removed   . GALLBLADDER SURGERY  11/13/2016  . GASTRIC ROUX-EN-Y N/A 04/27/2019   Procedure: LAPAROSCOPIC ROUX-EN-Y GASTRIC BYPASS WITH UPPER ENDOSCOPY, ERAS Pathway;  Surgeon: [name], MD;  Location: [facility];  Service: General;  Laterality: N/A;  . KIDNEY CYST REMOVAL Right 11/13/2016  . NEPHRECTOMY Right 2018  . 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  She moved to [place] from the [place], [place] area         2 daughters      No alcohol tobacco or drug use   family history includes Brain cancer in her sister; Esophageal cancer in her father; Hepatitis in her brother; Lung cancer in her mother; Prostate cancer in her father.   Review of Systems As per HPI.  Skin rash.  Uses CPAP for sleep apnea.  All other review of systems negative.  Objective:   Physical Exam  BP 118/88 (BP Location: Left Arm, Patient Position: Sitting, Cuff Size: Normal) Comment (Cuff Size): forearm  Pulse 80   Temp 98.5 F (36.9 C) (Oral)   Ht 5\' 5"  (1.651 m)   Wt (!) 354 lb 4 oz (160.7 kg)   BMI 58.95 kg/m  Obese pleasant white woman no acute distress Eyes anicteric Lungs are clear to auscultation Heart sounds are normal Abdomen is obese She is alert and oriented x3 Appropriate mood and affect

## 2019-07-26 NOTE — Progress Notes (Signed)
Kelsey Henson 63 y.o. April 07, 1956 [id number] Referred by: Practice, [place]:   Encounter Diagnoses  Name Primary?  Kelsey Henson Hx of adenomatous colonic polyps Yes  . History of uterine cancer   . Early satiety after gastric bypass   . Single kidney   . Gastroesophageal reflux disease, unspecified whether esophagitis present     Hx of adenomatous colonic polyps She had a "large tubulovillous adenoma" removed from the rectum.  I think this was in 2013 and then she had a heme positive stool and had a diminutive adenoma and a hyperplastic polyp on colonoscopy in 2015. It is reasonable to repeat a colonoscopy at this time.  The history of uterine cancer and kidney cancer and father with prostate cancer does raise some question of genetic syndrome.  She will be referred next for evaluation, she declined testing in the past because of cost, when she was in [place], [place].  Will use Prep-o-Pik given the early satiety issue status post gastric bypass.  Procedure needs to be done at the hospital given morbid obesity and BMI of 58.95.  Regarding GERD, she is on pantoprazole and that is probably not being absorbed well post gastric bypass.  When I see her for her colonoscopy will discuss changing to something with a capsule and granules.   I appreciate the opportunity to care for this patient.  CC: Practice, [facility]  Dr. Lisbeth Henson  Subjective:   Chief Complaint: History of colon polyps need for colonoscopy, difficulty tolerating prep  HPI The patient is a 63 year old married white woman with a history of a tubulovillous adenoma of the rectum around 2013 I think and then a subsequent colonoscopy for heme positive stool with a hyperplastic polyp at 80 cm and a diminutive adenoma in the rectum removed.  She was recommended to have a repeat exam in 5 years.  She had laparoscopic Roux-en-Y gastric bypass surgery and has lost 60 pounds or so so far but still  has a lot of difficulty with pain, anorexia and early satiety.  She is concerned about being able to tolerate No Known Allergies Current Meds  Medication Sig  . Calcium-Vitamin D-Vitamin K ([employer] CALCIUM SOFT CHEWS) 650-12.5-40 MG-MCG-MCG CHEW Chew by mouth.  . metoprolol tartrate (LOPRESSOR) 25 MG tablet Take 25 mg by mouth 2 (two) times daily.  . Multiple Vitamin (MULTIVITAMIN) tablet Take 1 tablet by mouth daily.  . pantoprazole (PROTONIX) 40 MG tablet Take 1 tablet (40 mg total) by mouth daily. Take this EVERY DAY, even if you are not having heartburn symptoms.  . pravastatin (PRAVACHOL) 40 MG tablet Take 40 mg by mouth at bedtime.   Past Medical History:  Diagnosis Date  . Arthritis   . Atrial fibrillation (HCC)    Transient, isolated  . Dyslipidemia   . Gastric bypass status for obesity 04/27/2019  . GERD (gastroesophageal reflux disease)   . Hx of adenomatous colonic polyps 2013  . Hypertension   . Migraines   . Morbid obesity ([place])   . Renal cancer ([place]) 2018   rt nephrectomy  . Single kidney   . Sleep apnea    wears CPAP  . Uterine cancer [name]) 2014   hysterectomy   Past Surgical History:  Procedure Laterality Date  . ABDOMINAL HYSTERECTOMY     Due to Cancer, complete hyst  . BUNIONECTOMY     left foot  . CHOLECYSTECTOMY    . COLONOSCOPY    . FRACTURE SURGERY Right  Arm repaired with plate but plate is now removed   . GALLBLADDER SURGERY  11/13/2016  . GASTRIC ROUX-EN-Y N/A 04/27/2019   Procedure: LAPAROSCOPIC ROUX-EN-Y GASTRIC BYPASS WITH UPPER ENDOSCOPY, ERAS Pathway;  Surgeon: [name], MD;  Location: [facility];  Service: General;  Laterality: N/A;  . KIDNEY CYST REMOVAL Right 11/13/2016  . NEPHRECTOMY Right 2018  . 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  She moved to [place] from the [place], [place] area         2 daughters      No alcohol tobacco or drug use   family history includes Brain cancer in her sister; Esophageal cancer in her father; Hepatitis in her brother; Lung cancer in her mother; Prostate cancer in her father.   Review of Systems As per HPI.  Skin rash.  Uses CPAP for sleep apnea.  All other review of systems negative.  Objective:   Physical Exam  BP 118/88 (BP Location: Left Arm, Patient Position: Sitting, Cuff Size: Normal) Comment (Cuff Size): forearm  Pulse 80   Temp 98.5 F (36.9 C) (Oral)   Ht 5\' 5"  (1.651 m)   Wt (!) 354 lb 4 oz (160.7 kg)   BMI 58.95 kg/m  Obese pleasant white woman no acute distress Eyes anicteric Lungs are clear to auscultation Heart sounds are normal Abdomen is obese She is alert and oriented x3 Appropriate mood and affect

## 2019-07-26 NOTE — Patient Instructions (Addendum)
You have been scheduled for a colonoscopy. Please follow written instructions given to you at your visit today.  Please pick up your prep supplies at the pharmacy within the next 1-3 days. Please pick up 4 DULCOLAX also for pre-prep.If you use inhalers (even only as needed), please bring them with you on the day of your procedure.   Due to recent COVID-19 restrictions implemented by our local and state authorities and in an effort to keep both patients and staff as safe as possible, our hospital system now requires COVID-19 testing prior to any scheduled hospital procedure. Please go to our [facility] location drive thru testing site ([address], [place], [place] [address]) on 08/10/2019 at  11:05AM. There will be multiple testing areas, the first checkpoint being for pre-procedure/surgery testing. Get into the right (yellow) lane that leads to the PAT testing team. You will not be billed at the time of testing but may receive a bill later depending on your insurance. The approximate cost of the test is $100. You must agree to quarantine from the time of your testing until the procedure date on 08/13/2019 . This should include staying at home with ONLY the people you live with. Avoid take-out, grocery store shopping or leaving the house for any non-emergent reason. Failure to have your COVID-19 test done on the date and time you have been scheduled will result in cancellation of procedure. Please call our office at [phone] if you have any questions.    We are going to get your records for review from [place].   You will be contact about setting up an appointment for Genetic Testing.   I appreciate the opportunity to care for you. Silvano Rusk, MD, [facility]

## 2019-07-26 NOTE — Assessment & Plan Note (Addendum)
She had a "large tubulovillous adenoma" removed from the rectum.  I think this was in 2013 and then she had a heme positive stool and had a diminutive adenoma and a hyperplastic polyp on colonoscopy in 2015. It is reasonable to repeat a colonoscopy at this time.  The history of uterine cancer and kidney cancer and father with prostate cancer does raise some question of genetic syndrome.  She will be referred next for evaluation, she declined testing in the past because of cost, when she was in [place], [place].  Will use Prep-o-Pik given the early satiety issue status post gastric bypass.  Procedure needs to be done at the hospital given morbid obesity and BMI of 58.95.

## 2019-07-28 ENCOUNTER — Telehealth: Payer: Self-pay | Admitting: Genetic Counselor

## 2019-07-28 NOTE — Telephone Encounter (Signed)
A genetic counseling appt has been scheduled for Kelsey Henson to see Roma Kayser on 11/9 at 10am. PT aware to arrive 15 minutes early.

## 2019-08-06 ENCOUNTER — Telehealth: Payer: Self-pay | Admitting: Genetic Counselor

## 2019-08-06 NOTE — Telephone Encounter (Signed)
Called patient regarding upcoming [facility] appointment, screener complete and e-mail has been sent.  °

## 2019-08-09 ENCOUNTER — Inpatient Hospital Stay: Payer: BC Managed Care – PPO | Attending: Genetic Counselor | Admitting: Genetic Counselor

## 2019-08-09 ENCOUNTER — Telehealth: Payer: Self-pay | Admitting: Internal Medicine

## 2019-08-09 ENCOUNTER — Other Ambulatory Visit: Payer: Self-pay

## 2019-08-09 ENCOUNTER — Encounter: Payer: Self-pay | Admitting: Genetic Counselor

## 2019-08-09 ENCOUNTER — Other Ambulatory Visit: Payer: BC Managed Care – PPO

## 2019-08-09 DIAGNOSIS — Z85528 Personal history of other malignant neoplasm of kidney: Secondary | ICD-10-CM | POA: Insufficient documentation

## 2019-08-09 DIAGNOSIS — Z808 Family history of malignant neoplasm of other organs or systems: Secondary | ICD-10-CM

## 2019-08-09 DIAGNOSIS — Z8542 Personal history of malignant neoplasm of other parts of uterus: Secondary | ICD-10-CM

## 2019-08-09 DIAGNOSIS — Z8042 Family history of malignant neoplasm of prostate: Secondary | ICD-10-CM | POA: Diagnosis not present

## 2019-08-09 DIAGNOSIS — Z86010 Personal history of colonic polyps: Secondary | ICD-10-CM

## 2019-08-09 DIAGNOSIS — Z800 Family history of malignant neoplasm of digestive organs: Secondary | ICD-10-CM | POA: Diagnosis not present

## 2019-08-09 DIAGNOSIS — Z860101 Personal history of adenomatous and serrated colon polyps: Secondary | ICD-10-CM

## 2019-08-09 NOTE — Telephone Encounter (Signed)
I spoke with the pharmacy and the prepopik is going to cost  $150. She tried several coupon cards and none got it lower.  Please advise. We don't have any samples of this, I checked. Her colon is Friday and she lives in [place], [place]. Thank you. I called and left her a voice mail that we are working on this situation.

## 2019-08-09 NOTE — Progress Notes (Signed)
REFERRING PROVIDER: Practice, [place],  [place] [address]  PRIMARY PROVIDER:  Practice, [facility]  PRIMARY REASON FOR VISIT:  1. Hx of adenomatous colonic polyps   2. Family history of stomach cancer   3. Family history of brain cancer   4. Family history of prostate cancer   5. History of uterine cancer   6. History of kidney cancer      HISTORY OF PRESENT ILLNESS:   Kelsey Henson, a 63 y.o. female, was seen for a [place] cancer genetics consultation at the request of the [facility] due to a personal and family history of cancer.  Kelsey Henson presents to clinic today to discuss the possibility of a hereditary predisposition to cancer, genetic testing, and to further clarify her future cancer risks, as well as potential cancer risks for family members.   In 2015, at the age of 62, Kelsey Henson was diagnosed with cancer of the uterus. The treatment plan included a hysterectomy.  In 2018, at the age of 38, Kelsey Henson was diagnosed with Stage III kidney cancer.  She had the kidney removed, and did not need chemotherapy.  She has a history of colon polyps, and had a colonoscopy every 5 years due to these.     CANCER HISTORY:  Oncology History   No history exists.     RISK FACTORS:  Menarche was at age 23.  First live birth at age 77.  OCP use for approximately 1 years.  Ovaries intact: no.  Hysterectomy: yes.  Menopausal status: postmenopausal.  HRT use: 0 years. Colonoscopy: yes; several colon polyps. Mammogram within the last year: yes. Number of breast biopsies: 0. Up to date with pelvic exams: no. Any excessive radiation exposure in the past: no  Past Medical History:  Diagnosis Date   Arthritis    Atrial fibrillation (HCC)    Transient, isolated   Dyslipidemia    Family history of brain cancer    Family history of prostate cancer    Family history of stomach cancer    Gastric bypass status  for obesity 04/27/2019   GERD (gastroesophageal reflux disease)    History of kidney cancer    History of uterine cancer    Hx of adenomatous colonic polyps 2013   Hypertension    Migraines    Morbid obesity ([place])    Renal cancer ([place]) 2018   rt nephrectomy   Single kidney    Sleep apnea    wears CPAP   Uterine cancer ([place]) 2014   hysterectomy    Past Surgical History:  Procedure Laterality Date   ABDOMINAL HYSTERECTOMY     Due to Cancer, complete hyst   BUNIONECTOMY     left foot   CHOLECYSTECTOMY     COLONOSCOPY     FRACTURE SURGERY Right    Arm repaired with plate but plate is now removed    GALLBLADDER SURGERY  11/13/2016   GASTRIC ROUX-EN-Y N/A 04/27/2019   Procedure: LAPAROSCOPIC ROUX-EN-Y GASTRIC BYPASS WITH UPPER ENDOSCOPY, ERAS Pathway;  Surgeon: [name], MD;  Location: [facility];  Service: General;  Laterality: N/A;   KIDNEY CYST REMOVAL Right 11/13/2016   NEPHRECTOMY Right 2018   REPLACEMENT TOTAL KNEE Right     Social History   Socioeconomic History   Marital status: Married    Spouse name: Doren Custard   Number of children: 2   Years of education: Not on file   Highest education  level: Not on file  Occupational History   Not on file  Social Needs   Financial resource strain: Not on file   Food insecurity    Worry: Not on file    Inability: Not on file   Transportation needs    Medical: Not on file    Non-medical: Not on file  Tobacco Use   Smoking status: Never Smoker   Smokeless tobacco: Never Used  Substance and Sexual Activity   Alcohol use: Yes    Comment: socially   Drug use: Never   Sexual activity: Not on file  Lifestyle   Physical activity    Days per week: Not on file    Minutes per session: Not on file   Stress: Not on file  Relationships   Social connections    Talks on phone: Not on file    Gets together: Not on file    Attends religious service: Not on file    Active member of club  or organization: Not on file    Attends meetings of clubs or organizations: Not on file    Relationship status: Not on file  Other Topics Concern   Not on file  Social History Narrative   She is married her husband has had renal transplants, 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  She moved to [place] from the [place], [place] area         2 daughters      No alcohol tobacco or drug use     FAMILY HISTORY:  We obtained a detailed, 4-generation family history.  Significant diagnoses are listed below: Family History  Problem Relation Age of Onset   Lung cancer Mother 66   Esophageal cancer Father 62   Prostate cancer Father    Brain cancer Sister 38       d. 42; Glioblastoma   Hepatitis Brother        d. 55   Stomach cancer Maternal Uncle    Parkinson's disease Paternal Uncle    Diabetes Maternal Grandmother    Heart attack Maternal Grandfather    Other Paternal Grandfather 48       killed during a mugging   Multiple myeloma Sister     The patient has two daughters who are cancer free.  She has one brother and three sisters.  Her brother died of Hepatitis C.  One sister had a glioblastoma at 58 and died at 94, another sister had multiple myeloma at 51 and died at 6, and a third sister is living and had skin cancer.  Both parents are deceased.  The patient's father had throat cancer at 69.  He later developed prostate and lung cancer and died at 15.  He had two brothers who are cancer free, but one brother had a son who had throat cancer with a 3 pack a day smoking history.  The grandparents are the paternal side died of non-cancer related issues.  The patient's mother died of lung cancer at 24.  She has one sister and three brothers.  One brother had stomach cancer.  The maternal grandparents are deceased.  Kelsey Henson is unaware of previous family history of genetic testing for  hereditary cancer risks. Patient's maternal ancestors are of [place] descent, and paternal ancestors are of [place] descent. There is no reported Ashkenazi Jewish ancestry. There is no known consanguinity.  GENETIC COUNSELING ASSESSMENT: Kelsey Henson is a 63  y.o. female with a personal and family history of cancer which is somewhat suggestive of a hereditary cancer syndrome and predisposition to cancer given the multiple cancers found in Lynch syndrome and her 8.7% risk on the [facility]. We, therefore, discussed and recommended the following at today's visit.   DISCUSSION: We discussed that 5 - 10% of cancer is hereditary.  Each cancer has it's own risk for being hereditary, with uterine cancer being associated with about 3-5% of cases being hereditary, most due to Lynch syndrome.  There are other genes that can be associated with hereditary uterine or kidney cancer syndromes.  We discussed that testing is beneficial for several reasons including knowing how to follow individuals after completing their treatment, identifying whether potential treatment options such as systemic therapy would be beneficial, and understand if other family members could be at risk for cancer and allow them to undergo genetic testing.   We reviewed the characteristics, features and inheritance patterns of hereditary cancer syndromes. We also discussed genetic testing, including the appropriate family members to test, the process of testing, insurance coverage and turn-around-time for results. We discussed the implications of a negative, positive and/or variant of uncertain significant result. In order to get genetic test results in a timely manner so that Kelsey Henson can use these genetic test results for surgical decisions, we recommended Kelsey Henson pursue genetic testing for the Lynch syndrome genes. Once complete, we recommend Kelsey Henson pursue reflex genetic testing to the multi cancer gene panel. The Multi-Gene Panel offered by [facility]  includes sequencing and/or deletion duplication testing of the following 85 genes: AIP, ALK, APC, ATM, AXIN2,BAP1,  BARD1, BLM, BMPR1A, BRCA1, BRCA2, BRIP1, CASR, CDC73, CDH1, CDK4, CDKN1B, CDKN1C, CDKN2A (p14ARF), CDKN2A (p16INK4a), CEBPA, CHEK2, CTNNA1, DICER1, DIS3L2, EGFR (c.2369C>T, p.Thr790Met variant only), EPCAM (Deletion/duplication testing only), FH, FLCN, GATA2, GPC3, GREM1 (Promoter region deletion/duplication testing only), HOXB13 (c.251G>A, p.Gly84Glu), HRAS, KIT, MAX, MEN1, MET, MITF (c.952G>A, p.Glu318Lys variant only), MLH1, MSH2, MSH3, MSH6, MUTYH, NBN, NF1, NF2, NTHL1, PALB2, PDGFRA, PHOX2B, PMS2, POLD1, POLE, POT1, PRKAR1A, PTCH1, PTEN, RAD50, RAD51C, RAD51D, RB1, RECQL4, RET, RNF43, RUNX1, SDHAF2, SDHA (sequence changes only), SDHB, SDHC, SDHD, SMAD4, SMARCA4, SMARCB1, SMARCE1, STK11, SUFU, TERC, TERT, TMEM127, TP53, TSC1, TSC2, VHL, WRN and WT1.    Based on Kelsey Henson's personal and family history of cancer, she meets medical criteria for genetic testing. Despite that she meets criteria, she may still have an out of pocket cost.   In order to estimate her chance of having a MMR (Lynch syndrome) mutation, we used statistical models (PREMM5) that consider her personal medical history, family history and ancestry.  Because each model is different, there can be a lot of variability in the risks they give.  Therefore, these numbers must be considered a rough range and not a precise risk of having a Lynch syndrome mutation.  These models estimate that she has approximately a 8.7% chance of having a mutation. Based on this assessment of her family and personal history, genetic testing is recommended.    PLAN: After considering the risks, benefits, and limitations, Kelsey Henson provided informed consent to pursue genetic testing.  She will have a saliva kit sent to her from [facility] for analysis of the Lynch syndrome and Multi cancer gene panel. Results should be available within  approximately 2-3 weeks' time, at which point they will be disclosed by telephone to Kelsey Henson, as will any additional recommendations warranted by these results. Kelsey Henson will receive a summary of her  genetic counseling visit and a copy of her results once available. This information will also be available in Epic.   Lastly, we encouraged Kelsey Henson to remain in contact with cancer genetics annually so that we can continuously update the family history and inform her of any changes in cancer genetics and testing that may be of benefit for this family.   Kelsey Henson questions were answered to her satisfaction today. Our contact information was provided should additional questions or concerns arise. Thank you for the referral and allowing [place] to share in the care of your patient.   Karen P. Florene Glen, [place], [facility] Licensed, Insurance risk surveyor [email or web address] phone: [phone]  The patient was seen for a total of 50 minutes in face-to-face genetic counseling.  This patient was discussed with Drs. Magrinat, Lindi Adie and/or Burr Medico who agrees with the above.    _______________________________________________________________________ For Office Staff:  Number of people involved in session: 1 Was an Intern/ student involved with case: yes Delorise Royals

## 2019-08-10 ENCOUNTER — Other Ambulatory Visit (HOSPITAL_COMMUNITY)
Admission: RE | Admit: 2019-08-10 | Discharge: 2019-08-10 | Disposition: A | Discharge status: Home / Self Care | Payer: BC Managed Care – PPO | Source: Ambulatory Visit | Attending: Internal Medicine | Admitting: Internal Medicine

## 2019-08-10 ENCOUNTER — Telehealth: Payer: Self-pay

## 2019-08-10 DIAGNOSIS — Z01812 Encounter for preprocedural laboratory examination: Secondary | ICD-10-CM | POA: Insufficient documentation

## 2019-08-10 DIAGNOSIS — Z20828 Contact with and (suspected) exposure to other viral communicable diseases: Secondary | ICD-10-CM | POA: Diagnosis not present

## 2019-08-10 DIAGNOSIS — C641 Malignant neoplasm of right kidney, except renal pelvis: Secondary | ICD-10-CM | POA: Diagnosis not present

## 2019-08-10 NOTE — Telephone Encounter (Signed)
Kelsey Henson came by and picked up her sample and new instructions.

## 2019-08-10 NOTE — Telephone Encounter (Signed)
Printed new Clenpik instructions for patient to pick up

## 2019-08-10 NOTE — Telephone Encounter (Signed)
Patient informed that we we will put a Clenpik sample up front for pick up. This replaces Prepopik.

## 2019-08-10 NOTE — Telephone Encounter (Signed)
See if Magda Paganini can get [place] a sample

## 2019-08-12 LAB — NOVEL CORONAVIRUS, NAA (HOSP ORDER, SEND-OUT TO REF LAB; TAT 18-24 HRS): SARS-CoV-2, NAA: NOT DETECTED

## 2019-08-13 ENCOUNTER — Encounter (HOSPITAL_COMMUNITY)
Admission: RE | Disposition: A | Discharge status: Home / Self Care | Payer: Self-pay | Source: Home / Self Care | Attending: Internal Medicine

## 2019-08-13 ENCOUNTER — Encounter (HOSPITAL_COMMUNITY): Payer: Self-pay | Admitting: Anesthesiology

## 2019-08-13 ENCOUNTER — Ambulatory Visit (HOSPITAL_COMMUNITY): Payer: BC Managed Care – PPO | Admitting: Anesthesiology

## 2019-08-13 ENCOUNTER — Ambulatory Visit (HOSPITAL_COMMUNITY)
Admission: RE | Admit: 2019-08-13 | Discharge: 2019-08-13 | Disposition: A | Discharge status: Home / Self Care | Payer: BC Managed Care – PPO | Attending: Internal Medicine | Admitting: Internal Medicine

## 2019-08-13 ENCOUNTER — Other Ambulatory Visit: Payer: Self-pay

## 2019-08-13 DIAGNOSIS — R6881 Early satiety: Secondary | ICD-10-CM | POA: Insufficient documentation

## 2019-08-13 DIAGNOSIS — Z86010 Personal history of colonic polyps: Secondary | ICD-10-CM | POA: Insufficient documentation

## 2019-08-13 DIAGNOSIS — Z8542 Personal history of malignant neoplasm of other parts of uterus: Secondary | ICD-10-CM | POA: Diagnosis not present

## 2019-08-13 DIAGNOSIS — E785 Hyperlipidemia, unspecified: Secondary | ICD-10-CM | POA: Insufficient documentation

## 2019-08-13 DIAGNOSIS — K5730 Diverticulosis of large intestine without perforation or abscess without bleeding: Secondary | ICD-10-CM | POA: Insufficient documentation

## 2019-08-13 DIAGNOSIS — K219 Gastro-esophageal reflux disease without esophagitis: Secondary | ICD-10-CM | POA: Diagnosis not present

## 2019-08-13 DIAGNOSIS — G4730 Sleep apnea, unspecified: Secondary | ICD-10-CM | POA: Insufficient documentation

## 2019-08-13 DIAGNOSIS — I10 Essential (primary) hypertension: Secondary | ICD-10-CM | POA: Diagnosis not present

## 2019-08-13 DIAGNOSIS — Z800 Family history of malignant neoplasm of digestive organs: Secondary | ICD-10-CM | POA: Insufficient documentation

## 2019-08-13 DIAGNOSIS — Z96651 Presence of right artificial knee joint: Secondary | ICD-10-CM | POA: Insufficient documentation

## 2019-08-13 DIAGNOSIS — Z9884 Bariatric surgery status: Secondary | ICD-10-CM | POA: Insufficient documentation

## 2019-08-13 DIAGNOSIS — Z1211 Encounter for screening for malignant neoplasm of colon: Secondary | ICD-10-CM | POA: Insufficient documentation

## 2019-08-13 DIAGNOSIS — Z85528 Personal history of other malignant neoplasm of kidney: Secondary | ICD-10-CM | POA: Insufficient documentation

## 2019-08-13 DIAGNOSIS — D122 Benign neoplasm of ascending colon: Secondary | ICD-10-CM | POA: Insufficient documentation

## 2019-08-13 DIAGNOSIS — Z905 Acquired absence of kidney: Secondary | ICD-10-CM | POA: Diagnosis not present

## 2019-08-13 DIAGNOSIS — Z6841 Body Mass Index (BMI) 40.0 and over, adult: Secondary | ICD-10-CM | POA: Insufficient documentation

## 2019-08-13 DIAGNOSIS — Z79899 Other long term (current) drug therapy: Secondary | ICD-10-CM | POA: Diagnosis not present

## 2019-08-13 DIAGNOSIS — K635 Polyp of colon: Secondary | ICD-10-CM | POA: Diagnosis not present

## 2019-08-13 DIAGNOSIS — K621 Rectal polyp: Secondary | ICD-10-CM | POA: Diagnosis not present

## 2019-08-13 HISTORY — PX: POLYPECTOMY: SHX5525

## 2019-08-13 HISTORY — PX: COLONOSCOPY WITH PROPOFOL: SHX5780

## 2019-08-13 SURGERY — COLONOSCOPY WITH PROPOFOL
Anesthesia: Monitor Anesthesia Care

## 2019-08-13 MED ORDER — PROPOFOL 10 MG/ML IV BOLUS
INTRAVENOUS | Status: DC | PRN
  Administered 2019-08-13 (×3): 20 mg via INTRAVENOUS

## 2019-08-13 MED ORDER — SODIUM CHLORIDE 0.9 % IV SOLN: INTRAVENOUS | Status: DC

## 2019-08-13 MED ORDER — PROPOFOL 500 MG/50ML IV EMUL
INTRAVENOUS | Status: DC | PRN
  Administered 2019-08-13: 100 ug/kg/min via INTRAVENOUS

## 2019-08-13 MED ORDER — PROPOFOL 500 MG/50ML IV EMUL
INTRAVENOUS | Status: AC
  Filled 2019-08-13: qty 50

## 2019-08-13 MED ORDER — LACTATED RINGERS IV SOLN
INTRAVENOUS | Status: DC
  Administered 2019-08-13: 08:00:00 via INTRAVENOUS

## 2019-08-13 MED ORDER — OMEPRAZOLE 40 MG PO CPDR: 40.0000 mg | DELAYED_RELEASE_CAPSULE | Freq: Every day | ORAL | 3 refills | Status: DC

## 2019-08-13 SURGICAL SUPPLY — 21 items

## 2019-08-13 NOTE — Anesthesia Preprocedure Evaluation (Signed)
Anesthesia Evaluation  Patient identified by MRN, date of birth, ID band Patient awake    Reviewed: Allergy & Precautions, NPO status , Patient's Chart, lab work & pertinent test results  Airway Mallampati: II  TM Distance: >3 FB Neck ROM: Full    Dental  (+) Teeth Intact, Dental Advisory Given   Pulmonary sleep apnea and Continuous Positive Airway Pressure Ventilation ,    breath sounds clear to auscultation       Cardiovascular hypertension, Pt. on home beta blockers + dysrhythmias Atrial Fibrillation  Rhythm:Regular Rate:Normal     Neuro/Psych  Headaches, negative psych ROS   GI/Hepatic Neg liver ROS, GERD  Medicated,  Endo/Other  negative endocrine ROS  Renal/GU Renal InsufficiencyRenal disease     Musculoskeletal  (+) Arthritis ,   Abdominal (+) + obese,   Peds  Hematology negative hematology ROS (+)   Anesthesia Other Findings   Reproductive/Obstetrics                             Anesthesia Physical Anesthesia Plan  ASA: III  Anesthesia Plan: MAC   Post-op Pain Management:    Induction: Intravenous  PONV Risk Score and Plan: 0 and Propofol infusion and Treatment may vary due to age or medical condition  Airway Management Planned: Simple Face Mask and Natural Airway  Additional Equipment: None  Intra-op Plan:   Post-operative Plan:   Informed Consent: I have reviewed the patients History and Physical, chart, labs and discussed the procedure including the risks, benefits and alternatives for the proposed anesthesia with the patient or authorized representative who has indicated his/her understanding and acceptance.       Plan Discussed with: CRNA  Anesthesia Plan Comments:         Anesthesia Quick Evaluation

## 2019-08-13 NOTE — Anesthesia Postprocedure Evaluation (Signed)
Anesthesia Post Note  Patient: Kelsey Henson  Procedure(s) Performed: COLONOSCOPY WITH PROPOFOL (N/A ) POLYPECTOMY     Patient location during evaluation: PACU Anesthesia Type: MAC Level of consciousness: awake and alert Pain management: pain level controlled Vital Signs Assessment: post-procedure vital signs reviewed and stable Respiratory status: spontaneous breathing, nonlabored ventilation, respiratory function stable and patient connected to nasal cannula oxygen Cardiovascular status: stable and blood pressure returned to baseline Postop Assessment: no apparent nausea or vomiting Anesthetic complications: no    Last Vitals:  Vitals:   08/13/19 0900 08/13/19 0910  BP: 119/72 122/71  Pulse: 65 61  Resp: (!) 21 17  Temp:    SpO2: 99% 97%    Last Pain:  Vitals:   08/13/19 0850  TempSrc: Oral  PainSc: 0-No pain                 Effie Berkshire

## 2019-08-13 NOTE — Anesthesia Procedure Notes (Signed)
Procedure Name: MAC Date/Time: 08/13/2019 8:24 AM Performed by: Niel Hummer, CRNA Pre-anesthesia Checklist: Patient identified, Emergency Drugs available, Suction available and Patient being monitored Patient Re-evaluated:Patient Re-evaluated prior to induction Oxygen Delivery Method: Simple face mask

## 2019-08-13 NOTE — Transfer of Care (Signed)
Immediate Anesthesia Transfer of Care Note  Patient: Kelsey Henson  Procedure(s) Performed: COLONOSCOPY WITH PROPOFOL (N/A ) POLYPECTOMY  Patient Location: Endoscopy Unit  Anesthesia Type:MAC  Level of Consciousness: drowsy  Airway & Oxygen Therapy: Patient Spontanous Breathing and Patient connected to face mask oxygen  Post-op Assessment: Report given to RN and Post -op Vital signs reviewed and stable  Post vital signs: Reviewed and stable  Last Vitals:  Vitals Value Taken Time  BP    Temp    Pulse    Resp    SpO2      Last Pain:  Vitals:   08/13/19 0728  TempSrc: Oral  PainSc: 0-No pain         Complications: No apparent anesthesia complications

## 2019-08-13 NOTE — Op Note (Signed)
[facility] Patient Name: Kelsey Henson Procedure Date: 08/13/2019 MRN: [id number] Attending MD: Gatha Mayer , MD Date of Birth: May 29, 1956 CSN: [id number] Age: 63 Admit Type: Outpatient Procedure:                Colonoscopy Indications:              Surveillance: Personal history of adenomatous                            polyps on last colonoscopy 5 years ago Providers:                Gatha Mayer, MD, Cleda Daub, RN, William Dalton, Technician Referring MD:              Medicines:                Propofol per Anesthesia, Monitored Anesthesia Care Complications:            No immediate complications. Estimated Blood Loss:     Estimated blood loss was minimal. Procedure:                Pre-Anesthesia Assessment:                           - Prior to the procedure, a History and Physical                            was performed, and patient medications and                            allergies were reviewed. The patient's tolerance of                            previous anesthesia was also reviewed. The risks                            and benefits of the procedure and the sedation                            options and risks were discussed with the patient.                            All questions were answered, and informed consent                            was obtained. Prior Anticoagulants: The patient has                            taken no previous anticoagulant or antiplatelet                            agents. ASA Grade Assessment: III - A patient with  severe systemic disease. After reviewing the risks                            and benefits, the patient was deemed in                            satisfactory condition to undergo the procedure.                           After obtaining informed consent, the colonoscope                            was passed under direct vision. Throughout the                    procedure, the patient's blood pressure, pulse, and                            oxygen saturations were monitored continuously. The                            [id number]) Olympus colonoscope was                            introduced through the anus and advanced to the the                            cecum, identified by appendiceal orifice and                            ileocecal valve. The colonoscopy was performed                            without difficulty. The patient tolerated the                            procedure well. The quality of the bowel                            preparation was excellent. The bowel preparation                            used was Clenpiq via split dose instruction. The                            ileocecal valve, appendiceal orifice, and rectum                            were photographed. Scope In: 8:30:05 AM Scope Out: 8:45:47 AM Scope Withdrawal Time: 0 hours 9 minutes 53 seconds  Total Procedure Duration: 0 hours 15 minutes 42 seconds  Findings:      The perianal and digital rectal examinations were normal.      Two sessile polyps were found in the rectum and ascending colon. The       polyps were 3 to 6  mm in size. These polyps were removed with a cold       snare. Resection and retrieval were complete. Verification of patient       identification for the specimen was done. Estimated blood loss was       minimal.      Multiple diverticula were found in the sigmoid colon, descending colon       and transverse colon.      The exam was otherwise without abnormality on direct and retroflexion       views. Impression:               - Two 3 to 6 mm polyps in the rectum and in the                            ascending colon, removed with a cold snare.                            Resected and retrieved.                           - Diverticulosis in the sigmoid colon, in the                            descending colon and in the  transverse colon.                           - The examination was otherwise normal on direct                            and retroflexion views.                           - Personal history of colonic polyps. large TV                            adenoma 2013 and 2015 diminutive adenoma and a                            hyperplastic polyp at 80 cm Moderate Sedation:      Not Applicable - Patient had care per Anesthesia. Recommendation:           - Patient has a contact number available for                            emergencies. The signs and symptoms of potential                            delayed complications were discussed with the                            patient. Return to normal activities tomorrow.                            Written discharge instructions were provided to the  patient.                           - Resume previous diet.                           - Continue present medications.                           - Repeat colonoscopy is recommended for                            surveillance. The colonoscopy date will be                            determined after pathology results from today's                            exam become available for review. Procedure Code(s):        --- Professional ---                           [phone], Colonoscopy, flexible; with removal of                            tumor(s), polyp(s), or other lesion(s) by snare                            technique Diagnosis Code(s):        --- Professional ---                           Z86.010, Personal history of colonic polyps                           K62.1, Rectal polyp                           K63.5, Polyp of colon                           K57.30, Diverticulosis of large intestine without                            perforation or abscess without bleeding CPT copyright 2019 American Medical Association. All rights reserved. The codes documented in this report are preliminary  and upon coder review may  be revised to meet current compliance requirements. Gatha Mayer, MD 08/13/2019 9:04:53 AM This report has been signed electronically. Number of Addenda: 0

## 2019-08-13 NOTE — Discharge Instructions (Addendum)
I found and removed two small polyps. Look benign but likely pre-cancerous (not cancer, however).  You also have a condition called diverticulosis - common and not usually a problem. Please read the handout provided.  I will let you know pathology results and when to have another routine colonoscopy by mail and/or My Chart.  I have stopped pantoprazole and prescribed omeprazole - that should be absorbed better in you as it is a capsule and not a tablet - because of your gastric bypass some tablets do not break down well.  I appreciate the opportunity to care for you. Gatha Mayer, MD, FACG   YOU HAD AN ENDOSCOPIC PROCEDURE TODAY: Refer to the procedure report and other information in the discharge instructions given to you for any specific questions about what was found during the examination. If this information does not answer your questions, please call Dr. Celesta Aver office at [phone] to clarify.   YOU SHOULD EXPECT: Some feelings of bloating in the abdomen. Passage of more gas than usual. Walking can help get rid of the air that was put into your GI tract during the procedure and reduce the bloating. If you had a lower endoscopy (such as a colonoscopy or flexible sigmoidoscopy) you may notice spotting of blood in your stool or on the toilet paper. Some abdominal soreness may be present for a day or two, also.  DIET: Your first meal following the procedure should be a light meal and then it is ok to progress to your normal diet. A half-sandwich or bowl of soup is an example of a good first meal. Heavy or fried foods are harder to digest and may make you feel nauseous or bloated. Drink plenty of fluids but you should avoid alcoholic beverages for 24 hours.   ACTIVITY: Your care partner should take you home directly after the procedure. You should plan to take it easy, moving slowly for the rest of the day. You can resume normal activity the day after the procedure however YOU SHOULD NOT  DRIVE, use power tools, machinery or perform tasks that involve climbing or major physical exertion for 24 hours (because of the sedation medicines used during the test).   SYMPTOMS TO REPORT IMMEDIATELY: A gastroenterologist can be reached at any hour. Please call [phone]  for any of the following symptoms:  Following lower endoscopy (colonoscopy, flexible sigmoidoscopy) Excessive amounts of blood in the stool  Significant tenderness, worsening of abdominal pains  Swelling of the abdomen that is new, acute  Fever of 100 or higher

## 2019-08-13 NOTE — Interval H&P Note (Signed)
History and Physical Interval Note:  08/13/2019 7:57 AM  Kelsey Henson  has presented today for surgery, with the diagnosis of Hx of polyps.  The various methods of treatment have been discussed with the patient and family. After consideration of risks, benefits and other options for treatment, the patient has consented to  Procedure(s): COLONOSCOPY WITH PROPOFOL (N/A) as a surgical intervention.  The patient's history has been reviewed, patient examined, no change in status, stable for surgery.  I have reviewed the patient's chart and labs.  Questions were answered to the patient's satisfaction.     Silvano Rusk

## 2019-08-16 ENCOUNTER — Encounter (HOSPITAL_COMMUNITY): Payer: Self-pay | Admitting: Internal Medicine

## 2019-08-16 LAB — SURGICAL PATHOLOGY

## 2019-08-18 ENCOUNTER — Encounter: Payer: Self-pay | Admitting: Internal Medicine

## 2019-08-18 ENCOUNTER — Ambulatory Visit (HOSPITAL_COMMUNITY)
Admission: RE | Admit: 2019-08-18 | Discharge: 2019-08-18 | Disposition: A | Discharge status: Home / Self Care | Payer: BC Managed Care – PPO | Source: Ambulatory Visit | Attending: Urology | Admitting: Urology

## 2019-08-18 ENCOUNTER — Other Ambulatory Visit: Payer: Self-pay

## 2019-08-18 DIAGNOSIS — C641 Malignant neoplasm of right kidney, except renal pelvis: Secondary | ICD-10-CM | POA: Insufficient documentation

## 2019-08-18 LAB — POCT I-STAT CREATININE: Creatinine, Ser: 0.9 mg/dL (ref 0.44–1.00)

## 2019-08-18 MED ORDER — SODIUM CHLORIDE (PF) 0.9 % IJ SOLN
INTRAMUSCULAR | Status: AC
  Filled 2019-08-18: qty 50

## 2019-08-18 MED ORDER — IOHEXOL 300 MG/ML  SOLN
100.0000 mL | Freq: Once | INTRAMUSCULAR | Status: AC | PRN
  Administered 2019-08-18: 100 mL via INTRAVENOUS

## 2019-08-18 NOTE — Progress Notes (Signed)
2 adenomas recall 2025 Letter created

## 2019-08-25 ENCOUNTER — Other Ambulatory Visit: Payer: Self-pay | Admitting: Family Medicine

## 2019-08-25 DIAGNOSIS — Z1231 Encounter for screening mammogram for malignant neoplasm of breast: Secondary | ICD-10-CM

## 2019-09-10 ENCOUNTER — Telehealth: Payer: Self-pay | Admitting: Genetic Counselor

## 2019-09-10 NOTE — Telephone Encounter (Signed)
The lab preauthorized her sample and estimated her OOP cost to be $0.

## 2019-09-11 DIAGNOSIS — Z86010 Personal history of colonic polyps: Secondary | ICD-10-CM | POA: Diagnosis not present

## 2019-09-11 DIAGNOSIS — Z800 Family history of malignant neoplasm of digestive organs: Secondary | ICD-10-CM | POA: Diagnosis not present

## 2019-09-11 DIAGNOSIS — Z85528 Personal history of other malignant neoplasm of kidney: Secondary | ICD-10-CM | POA: Diagnosis not present

## 2019-09-11 DIAGNOSIS — Z8542 Personal history of malignant neoplasm of other parts of uterus: Secondary | ICD-10-CM | POA: Diagnosis not present

## 2019-09-13 DIAGNOSIS — C641 Malignant neoplasm of right kidney, except renal pelvis: Secondary | ICD-10-CM | POA: Diagnosis not present

## 2019-09-28 DIAGNOSIS — Z20828 Contact with and (suspected) exposure to other viral communicable diseases: Secondary | ICD-10-CM | POA: Diagnosis not present

## 2019-09-29 ENCOUNTER — Encounter: Payer: Self-pay | Admitting: Genetic Counselor

## 2019-09-29 ENCOUNTER — Telehealth: Payer: Self-pay | Admitting: Genetic Counselor

## 2019-09-29 ENCOUNTER — Ambulatory Visit: Payer: Self-pay | Admitting: Genetic Counselor

## 2019-09-29 DIAGNOSIS — Z1379 Encounter for other screening for genetic and chromosomal anomalies: Secondary | ICD-10-CM | POA: Insufficient documentation

## 2019-09-29 NOTE — Progress Notes (Signed)
HPI:  Ms. Kelsey Henson was previously seen in the [place] [facility] due to a personal and family history of cancer, a personal history of colon polyps and concerns regarding a hereditary predisposition to cancer. Please refer to our prior cancer genetics clinic note for more information regarding our discussion, assessment and recommendations, at the time. Ms. Kelsey Henson recent genetic test results were disclosed to her, as were recommendations warranted by these results. These results and recommendations are discussed in more detail below.  CANCER HISTORY:  Oncology History   No history exists.    FAMILY HISTORY:  We obtained a detailed, 4-generation family history.  Significant diagnoses are listed below: Family History  Problem Relation Age of Onset  . Lung cancer Mother 85  . Esophageal cancer Father 80  . Prostate cancer Father   . Brain cancer Sister 51       d. 47; Glioblastoma  . Hepatitis Brother        d. 73  . Stomach cancer Maternal Uncle   . Parkinson's disease Paternal Uncle   . Diabetes Maternal Grandmother   . Heart attack Maternal Grandfather   . Other Paternal Grandfather 68       killed during a mugging  . Multiple myeloma Sister     The patient has two daughters who are cancer free.  She has one brother and three sisters.  Her brother died of Hepatitis C.  One sister had a glioblastoma at 80 and died at 65, another sister had multiple myeloma at 75 and died at 37, and a third sister is living and had skin cancer.  Both parents are deceased.  The patient's father had throat cancer at 20.  He later developed prostate and lung cancer and died at 55.  He had two brothers who are cancer free, but one brother had a son who had throat cancer with a 3 pack a day smoking history.  The grandparents are the paternal side died of non-cancer related issues.  The patient's mother died of lung cancer at 48.  She has one sister and three brothers.  One brother had stomach  cancer.  The maternal grandparents are deceased.  Ms. Kelsey Henson is unaware of previous family history of genetic testing for hereditary cancer risks. Patient's maternal ancestors are of [place] descent, and paternal ancestors are of [place] descent. There is no reported Ashkenazi Jewish ancestry. There is no known consanguinity.    GENETIC TEST RESULTS: Genetic testing reported out on September 26, 2019 through the Multi-cancer panel found no pathogenic mutations. The Multi-Gene Panel offered by [facility] includes sequencing and/or deletion duplication testing of the following 85 genes: AIP, ALK, APC, ATM, AXIN2,BAP1,  BARD1, BLM, BMPR1A, BRCA1, BRCA2, BRIP1, CASR, CDC73, CDH1, CDK4, CDKN1B, CDKN1C, CDKN2A (p14ARF), CDKN2A (p16INK4a), CEBPA, CHEK2, CTNNA1, DICER1, DIS3L2, EGFR (c.2369C>T, p.Thr790Met variant only), EPCAM (Deletion/duplication testing only), FH, FLCN, GATA2, GPC3, GREM1 (Promoter region deletion/duplication testing only), HOXB13 (c.251G>A, p.Gly84Glu), HRAS, KIT, MAX, MEN1, MET, MITF (c.952G>A, p.Glu318Lys variant only), MLH1, MSH2, MSH3, MSH6, MUTYH, NBN, NF1, NF2, NTHL1, PALB2, PDGFRA, PHOX2B, PMS2, POLD1, POLE, POT1, PRKAR1A, PTCH1, PTEN, RAD50, RAD51C, RAD51D, RB1, RECQL4, RET, RNF43, RUNX1, SDHAF2, SDHA (sequence changes only), SDHB, SDHC, SDHD, SMAD4, SMARCA4, SMARCB1, SMARCE1, STK11, SUFU, TERC, TERT, TMEM127, TP53, TSC1, TSC2, VHL, WRN and WT1.  . The test report has been scanned into EPIC and is located under the Molecular Pathology section of the Results Review tab.  A portion of the result report is included below for reference.  We discussed with Ms. Kelsey Henson that because current genetic testing is not perfect, it is possible there may be a gene mutation in one of these genes that current testing cannot detect, but that chance is small.  We also discussed, that there could be another gene that has not yet been discovered, or that we have not yet tested, that is responsible for the cancer  diagnoses in the family. It is also possible there is a hereditary cause for the cancer in the family that Ms. Kelsey Henson did not inherit and therefore was not identified in her testing.  Therefore, it is important to remain in touch with cancer genetics in the future so that we can continue to offer Ms. Kelsey Henson the most up to date genetic testing.   ADDITIONAL GENETIC TESTING: We discussed with Ms. Kelsey Henson that her genetic testing was fairly extensive.  If there are genes identified to increase cancer risk that can be analyzed in the future, we would be happy to discuss and coordinate this testing at that time.    CANCER SCREENING RECOMMENDATIONS: Ms. Kelsey Henson test result is considered negative (normal).  This means that we have not identified a hereditary cause for her personal and family history of cancer and personal history of colon polyps at this time. Most cancers happen by chance and this negative test suggests that her cancer may fall into this category.    While reassuring, this does not definitively rule out a hereditary predisposition to cancer. It is still possible that there could be genetic mutations that are undetectable by current technology. There could be genetic mutations in genes that have not been tested or identified to increase cancer risk.  Therefore, it is recommended she continue to follow the cancer management and screening guidelines provided by her primary healthcare provider.   An individual's cancer risk and medical management are not determined by genetic test results alone. Overall cancer risk assessment incorporates additional factors, including personal medical history, family history, and any available genetic information that may result in a personalized plan for cancer prevention and surveillance  RECOMMENDATIONS FOR FAMILY MEMBERS:  Individuals in this family might be at some increased risk of developing cancer, over the general population risk, simply due to the family  history of cancer.  We recommended women in this family have a yearly mammogram beginning at age 33, or 80 years younger than the earliest onset of cancer, an annual clinical breast exam, and perform monthly breast self-exams. Women in this family should also have a gynecological exam as recommended by their primary provider. All family members should have a colonoscopy by age 51.  FOLLOW-UP: Lastly, we discussed with Ms. Kelsey Henson that cancer genetics is a rapidly advancing field and it is possible that new genetic tests will be appropriate for her and/or her family members in the future. We encouraged her to remain in contact with cancer genetics on an annual basis so we can update her personal and family histories and let her know of advances in cancer genetics that may benefit this family.   Our contact number was provided. Ms. Kelsey Henson questions were answered to her satisfaction, and she knows she is welcome to call us at anytime with additional questions or concerns.   Roma Kayser, [place], [facility] Licensed, Certified Genetic Counselor [email or web address]

## 2019-09-29 NOTE — Telephone Encounter (Signed)
Revealed negative genetic testing.  Discussed that we do not know why she has had uterine and kidney cancer as well as polyposis of the colon, or why there is cancer in the family. It could be due to a different gene that we are not testing, or maybe our current technology may not be able to pick something up.  It will be important for her to keep in contact with genetics to keep up with whether additional testing may be needed.

## 2019-10-11 DIAGNOSIS — E783 Hyperchylomicronemia: Secondary | ICD-10-CM | POA: Diagnosis not present

## 2019-10-11 DIAGNOSIS — Z79899 Other long term (current) drug therapy: Secondary | ICD-10-CM | POA: Diagnosis not present

## 2019-10-11 DIAGNOSIS — E785 Hyperlipidemia, unspecified: Secondary | ICD-10-CM | POA: Diagnosis not present

## 2019-10-11 DIAGNOSIS — I10 Essential (primary) hypertension: Secondary | ICD-10-CM | POA: Diagnosis not present

## 2019-10-11 DIAGNOSIS — Z9884 Bariatric surgery status: Secondary | ICD-10-CM | POA: Diagnosis not present

## 2019-10-11 DIAGNOSIS — I4891 Unspecified atrial fibrillation: Secondary | ICD-10-CM | POA: Diagnosis not present

## 2019-10-12 ENCOUNTER — Encounter: Payer: BC Managed Care – PPO | Attending: Surgery | Admitting: Skilled Nursing Facility1

## 2019-10-12 ENCOUNTER — Other Ambulatory Visit: Payer: Self-pay

## 2019-10-12 DIAGNOSIS — E669 Obesity, unspecified: Secondary | ICD-10-CM | POA: Diagnosis not present

## 2019-10-13 NOTE — Progress Notes (Signed)
Follow-up visit:  Post-Operative RYGB Surgery  Medical Nutrition Therapy:  Appt start time: 6:00pm end time:  7:00pm  Primary concerns today: Post-operative Bariatric Surgery Nutrition Management 6 Month Post-Op Class  Anthropometrics  Start weight at [facility]: 419.4 lbs (11/24/2018) Today's weight: 329.6  Body Composition Scale 05/11/2019 06/22/2019 10/13/2019  Weight  lbs 384.2 362.7 329.6  BMI 63.9 60 54.5  Total Body Fat  % 54.4 53.5 52     Visceral Fat 33 31 27  Fat-Free Mass  % 45.5 46.4 47.9     Total Body Water  % 37.2 37.7 38.4     Muscle-Mass  lbs 31.6 31.4 31  Body Fat Displacement  ---          Torso  lbs 129.9 120.7 106.5         Left Leg  lbs 25.9 24.1 21.3         Right Leg  lbs 25.9 24.1 21.3         Left Arm  lbs 12.9 12 10.6         Right Arm  lbs 12.9 12 10.6      Information Reviewed/ Discussed During Appointment: -Review of composition scale numbers -Fluid requirements (64-100 ounces) -Protein requirements (60-80g) -Strategies for tolerating diet -Advancement of diet to include Starchy vegetables -Barriers to inclusion of new foods -Inclusion of appropriate multivitamin and calcium supplements  -Exercise recommendations   Fluid intake: adequate   Medications: See List Supplementation: appropriate   Using straws: no Drinking while eating: no Having you been chewing well: yes Chewing/swallowing difficulties: no Changes in vision: no Changes to mood/headaches: no Hair loss/Cahnges to skin/Changes to nails: no Any difficulty focusing or concentrating: no Sweating: no Dizziness/Lightheaded: no Palpitations: no  Carbonated beverages: no N/V/D/C/GAS: no Abdominal Pain: no Dumping syndrome: no  Recent physical activity:  ADL's  Progress Towards Goal(s):  In Progress  Handouts given during visit include:  Phase V diet Progression   Goals Sheet  The Benefits of Exercise are endless.....  Support Group Topics  Pt Chosen Goals: I will eat  non-starchy vegetables at least 2 separate times in a day 7 days a week by (specific date) _06/27/21____ I will not drink or sip a beverage with 3 of my meals 7 days a week by (specific date) ___02/28/21_____________ Mental Health and Well Being: I will say 2 nice things to and about myself 7 days a week by (specific date) __08/19/21____________ Physical Activity Goals: I will (type of movement) ____bike_____________________ for (hours/minutes) ___40_______, for (how many days a week) ____________5______________ by (specific date) _____6/27/21___________ I will stand up and stretch for each commercial while watching television by (specific date) __03/15/21________  Teaching Method Utilized:  Visual Auditory Hands on  Demonstrated degree of understanding via:  Teach Back   Monitoring/Evaluation:  Dietary intake, exercise, and body weight. Follow up in 3 months for 9 month post-op visit.

## 2019-10-20 ENCOUNTER — Ambulatory Visit
Admission: RE | Admit: 2019-10-20 | Discharge: 2019-10-20 | Disposition: A | Discharge status: Home / Self Care | Payer: BC Managed Care – PPO | Source: Ambulatory Visit | Attending: Family Medicine | Admitting: Family Medicine

## 2019-10-20 ENCOUNTER — Ambulatory Visit: Payer: BC Managed Care – PPO

## 2019-10-20 ENCOUNTER — Other Ambulatory Visit: Payer: Self-pay

## 2019-10-20 DIAGNOSIS — Z1231 Encounter for screening mammogram for malignant neoplasm of breast: Secondary | ICD-10-CM | POA: Diagnosis not present

## 2020-01-12 ENCOUNTER — Ambulatory Visit: Payer: Self-pay | Admitting: Skilled Nursing Facility1

## 2020-01-13 ENCOUNTER — Encounter: Payer: Self-pay | Admitting: Dietician

## 2020-01-13 ENCOUNTER — Encounter: Payer: BC Managed Care – PPO | Attending: Surgery | Admitting: Dietician

## 2020-01-13 ENCOUNTER — Other Ambulatory Visit: Payer: Self-pay

## 2020-01-13 DIAGNOSIS — E669 Obesity, unspecified: Secondary | ICD-10-CM | POA: Insufficient documentation

## 2020-01-13 NOTE — Patient Instructions (Signed)
.   Continue to aim for a minimum of 64 fluid ounces daily with at least 32 ounces being plain water. . Eat non-starchy vegetables 2 times a day 7 days a week. . Per meal/snack, eat 2-3 ounces of protein first. o Add in fruit throughout the day which you may substitute starchy vegetables with (fruits and starchy vegetables are both great sources of complex carbohydrates.) o Eat fruits as long as you are able to meet your daily protein goal and still get in non-starchy vegetables 2 times per day. Whatever else you have room for may be fruit and/or starchy vegetables (potatoes, sweet potatoes, corn, and peas.)  . Continue to aim for 30 minutes of physical activity at least 5 times a week. . Remember to take 3 calcium's plus your bariatric multivitamin DAILY.  

## 2020-01-13 NOTE — Progress Notes (Signed)
Bariatric Nutrition Follow-Up Visit Medical Nutrition Therapy   9 Months Post-Operative RYGB Surgery Surgery Date: 04/27/2019  Pt's Expectations of Surgery/ Goals: to lose weight Pt Reported Successes: more energy, can walk more, can stand longer when cooking, doesn't use the scooter in the store, more hobbies, smaller clothes  NUTRITION ASSESSMENT  Anthropometrics  Start weight at [facility]: 419 lbs (date: 11/24/2018) Today's weight: 311.7 lbs  Body Composition Scale 05/11/2019 06/22/2019 10/13/2019 01/13/2020  Weight  lbs 384.2 362.7 329.6 311.7  BMI 63.9 60 54.5 51.6  Total Body Fat  % 54.4 53.5 52 51.1     Visceral Fat 33 31 27 26   Fat-Free Mass  % 45.5 46.4 47.9 48.8     Total Body Water  % 37.2 37.7 38.4 38.9     Muscle-Mass  lbs 31.6 31.4 31 30.7  Body Fat Displacement  ---  ---         Torso  lbs 129.9 120.7 106.5 99         Left Leg  lbs 25.9 24.1 21.3 19.8         Right Leg  lbs 25.9 24.1 21.3 19.8         Left Arm  lbs 12.9 12 10.6 9.9         Right Arm  lbs 12.9 12 10.6 9.9   Lifestyle & Dietary Hx Patient states she sometimes meal preps on the weekend to prepare breakfasts for the week. Will sometimes cook a large meal to have leftovers throughout the week. Typical meal pattern is 3 meals plus 1-2 snacks per day.   24-Hr Dietary Recall First Meal: 2 oz egg mixture (egg + sausage + milk)  Snack: granola bar (or graham crackers + peanut butter)   Second Meal: [place] deli meat + cheese + crackers  Snack: -  Third Meal: 3 oz protein (chicken or seafood) + broccoli Snack: cookie Beverages: water, coffee, tea w/ Splenda  Estimated daily fluid intake: 50-60 oz Estimated daily protein intake: 60 g Supplements: bariatric MVI, calcium  Current average weekly physical activity: ADLs, yard/housework    Post-Op Goals/ Signs/ Symptoms Using straws: no Drinking while eating: sips Chewing/swallowing difficulties: no Changes in vision: no Changes to mood/headaches: no Hair  loss/changes to skin/nails: no Difficulty focusing/concentrating: no Sweating: no Dizziness/lightheadedness: no Palpitations: no  Carbonated/caffeinated beverages: no N/V/D/C/Gas: no Abdominal pain: no Dumping syndrome: yes (twice, due to eating too much)    NUTRITION DIAGNOSIS  Overweight/obesity ([place]-3.3) related to past poor dietary habits and physical inactivity as evidenced by completed bariatric surgery and following dietary guidelines for continued weight loss and healthy nutrition status.   NUTRITION INTERVENTION Nutrition counseling (C-1) and education (E-2) to facilitate bariatric surgery goals, including: . Diet advancement to the next phase (phase 6) now including fruit . The importance of consuming adequate calories as well as certain nutrients daily due to the body's need for essential vitamins, minerals, and fats . The importance of daily physical activity and to reach a goal of at least 150 minutes of moderate to vigorous physical activity weekly (or as directed by their physician) due to benefits such as increased musculature and improved lab values  Handouts Provided Include   Phase 6: Protein + Vegetables + Fruit  Learning Style & Readiness for Change Teaching method utilized: Visual & Auditory  Demonstrated degree of understanding via: Teach Back  Barriers to learning/adherence to lifestyle change: None Identified    MONITORING & EVALUATION Dietary intake, weekly physical activity, body weight,  and goals  in 3 months.  Next Steps Patient is to follow-up in 3 months for 12 month post-op follow-up.

## 2020-03-01 DIAGNOSIS — K5730 Diverticulosis of large intestine without perforation or abscess without bleeding: Secondary | ICD-10-CM | POA: Diagnosis not present

## 2020-03-01 DIAGNOSIS — I700 Atherosclerosis of aorta: Secondary | ICD-10-CM | POA: Diagnosis not present

## 2020-03-01 DIAGNOSIS — C641 Malignant neoplasm of right kidney, except renal pelvis: Secondary | ICD-10-CM | POA: Diagnosis not present

## 2020-03-01 DIAGNOSIS — R3121 Asymptomatic microscopic hematuria: Secondary | ICD-10-CM | POA: Diagnosis not present

## 2020-03-03 DIAGNOSIS — C641 Malignant neoplasm of right kidney, except renal pelvis: Secondary | ICD-10-CM | POA: Diagnosis not present

## 2020-03-03 DIAGNOSIS — R3121 Asymptomatic microscopic hematuria: Secondary | ICD-10-CM | POA: Diagnosis not present

## 2020-04-18 ENCOUNTER — Encounter: Payer: BC Managed Care – PPO | Attending: Surgery | Admitting: Skilled Nursing Facility1

## 2020-04-18 ENCOUNTER — Other Ambulatory Visit: Payer: Self-pay

## 2020-04-19 NOTE — Progress Notes (Signed)
Bariatric Class:  Appt start time: 6:00 end time: 7:00  12 Month Post-Operative Nutrition Class  Patient was seen on 04/18/2020 for Post[facility]ative Nutrition [facility].   Surgery date: 04/27/2019 Surgery type: RYGB Start weight at [facility]: 419.4 Weight today: 299.7  Body Composition Scale 04/19/2020  Total Body Fat % 50  Visceral Fat 23  Fat-Free Mass % 49.9   Total Body Water % 39.4   Muscle-Mass lbs 31.1  Body Fat Displacement          Torso  lbs 93         Left Leg  lbs 18.6         Right Leg  lbs 18.6         Left Arm  lbs 9.3         Right Arm   lbs 9.3    The following the learning objectives were met by the patient during this course:  Review of TANITA scale information  Share and discuss bariatric surgery successes and non-scale victories  Identifies Phase VII (Maintenance Phase) Dietary Goals which will be lifelong  Identifies appropriate sources of fluids, proteins, non-starchy vegetables, and complex carbohydrates  Identifies well-balanced meals  Identifies portion control   Identifies appropriate multivitamin and calcium sources post-operatively  Describes the need for physical activity post-operatively and will follow MD recommendations  Identifies and describes SMART goals   Creates at least 2 SMART goals to begin immediately  States when to call healthcare provider regarding medication questions or post-operative complications  Handouts given during class include:  Phase VII: Maintenance Phase-Lifelong  Follow-Up Plan: Patient will follow-up at [facility] for on-going post-op nutrition visits.

## 2020-05-15 DIAGNOSIS — Z20822 Contact with and (suspected) exposure to covid-19: Secondary | ICD-10-CM | POA: Diagnosis not present

## 2020-05-26 DIAGNOSIS — E785 Hyperlipidemia, unspecified: Secondary | ICD-10-CM | POA: Diagnosis not present

## 2020-05-26 DIAGNOSIS — I10 Essential (primary) hypertension: Secondary | ICD-10-CM | POA: Diagnosis not present

## 2020-05-26 DIAGNOSIS — R7989 Other specified abnormal findings of blood chemistry: Secondary | ICD-10-CM | POA: Diagnosis not present

## 2020-05-26 DIAGNOSIS — K219 Gastro-esophageal reflux disease without esophagitis: Secondary | ICD-10-CM | POA: Diagnosis not present

## 2020-05-26 DIAGNOSIS — Z9884 Bariatric surgery status: Secondary | ICD-10-CM | POA: Diagnosis not present

## 2020-08-03 DIAGNOSIS — M1990 Unspecified osteoarthritis, unspecified site: Secondary | ICD-10-CM | POA: Diagnosis not present

## 2020-08-04 DIAGNOSIS — M25562 Pain in left knee: Secondary | ICD-10-CM | POA: Diagnosis not present

## 2020-08-04 DIAGNOSIS — Z6841 Body Mass Index (BMI) 40.0 and over, adult: Secondary | ICD-10-CM | POA: Diagnosis not present

## 2020-08-04 DIAGNOSIS — L309 Dermatitis, unspecified: Secondary | ICD-10-CM | POA: Diagnosis not present

## 2020-08-04 DIAGNOSIS — Z23 Encounter for immunization: Secondary | ICD-10-CM | POA: Diagnosis not present

## 2020-08-07 ENCOUNTER — Other Ambulatory Visit: Payer: Self-pay | Admitting: Family Medicine

## 2020-08-07 DIAGNOSIS — Z1231 Encounter for screening mammogram for malignant neoplasm of breast: Secondary | ICD-10-CM

## 2020-08-17 DIAGNOSIS — M25561 Pain in right knee: Secondary | ICD-10-CM | POA: Diagnosis not present

## 2020-08-17 DIAGNOSIS — Z96651 Presence of right artificial knee joint: Secondary | ICD-10-CM | POA: Diagnosis not present

## 2020-08-17 DIAGNOSIS — M25562 Pain in left knee: Secondary | ICD-10-CM | POA: Diagnosis not present

## 2020-08-29 DIAGNOSIS — M6281 Muscle weakness (generalized): Secondary | ICD-10-CM | POA: Diagnosis not present

## 2020-08-29 DIAGNOSIS — M25562 Pain in left knee: Secondary | ICD-10-CM | POA: Diagnosis not present

## 2020-08-29 DIAGNOSIS — M25561 Pain in right knee: Secondary | ICD-10-CM | POA: Diagnosis not present

## 2020-10-05 ENCOUNTER — Other Ambulatory Visit: Payer: Self-pay | Admitting: Internal Medicine

## 2020-10-25 ENCOUNTER — Ambulatory Visit: Payer: BC Managed Care – PPO

## 2020-11-03 ENCOUNTER — Other Ambulatory Visit: Payer: Self-pay

## 2020-11-03 ENCOUNTER — Ambulatory Visit
Admission: RE | Admit: 2020-11-03 | Discharge: 2020-11-03 | Disposition: A | Discharge status: Home / Self Care | Payer: BC Managed Care – PPO | Source: Ambulatory Visit | Attending: Family Medicine | Admitting: Family Medicine

## 2020-11-03 DIAGNOSIS — Z1231 Encounter for screening mammogram for malignant neoplasm of breast: Secondary | ICD-10-CM | POA: Diagnosis not present

## 2020-11-29 DIAGNOSIS — Z79899 Other long term (current) drug therapy: Secondary | ICD-10-CM | POA: Diagnosis not present

## 2020-11-29 DIAGNOSIS — E785 Hyperlipidemia, unspecified: Secondary | ICD-10-CM | POA: Diagnosis not present

## 2020-11-29 DIAGNOSIS — Z9884 Bariatric surgery status: Secondary | ICD-10-CM | POA: Diagnosis not present

## 2020-11-29 DIAGNOSIS — I10 Essential (primary) hypertension: Secondary | ICD-10-CM | POA: Diagnosis not present

## 2020-11-29 DIAGNOSIS — K219 Gastro-esophageal reflux disease without esophagitis: Secondary | ICD-10-CM | POA: Diagnosis not present

## 2020-11-29 DIAGNOSIS — Z1331 Encounter for screening for depression: Secondary | ICD-10-CM | POA: Diagnosis not present

## 2020-12-28 ENCOUNTER — Other Ambulatory Visit: Payer: Self-pay | Admitting: Internal Medicine

## 2021-01-09 ENCOUNTER — Other Ambulatory Visit: Payer: Self-pay | Admitting: Neurology

## 2021-01-09 ENCOUNTER — Encounter: Payer: Self-pay | Admitting: Neurology

## 2021-01-10 ENCOUNTER — Encounter: Payer: Self-pay | Admitting: Neurology

## 2021-01-10 ENCOUNTER — Ambulatory Visit (INDEPENDENT_AMBULATORY_CARE_PROVIDER_SITE_OTHER): Payer: BC Managed Care – PPO | Admitting: Neurology

## 2021-01-10 VITALS — BP 132/68 | HR 57 | Ht 65.0 in | Wt 309.0 lb

## 2021-01-10 DIAGNOSIS — E662 Morbid (severe) obesity with alveolar hypoventilation: Secondary | ICD-10-CM | POA: Diagnosis not present

## 2021-01-10 DIAGNOSIS — E669 Obesity, unspecified: Secondary | ICD-10-CM | POA: Diagnosis not present

## 2021-01-10 DIAGNOSIS — G4733 Obstructive sleep apnea (adult) (pediatric): Secondary | ICD-10-CM

## 2021-01-10 DIAGNOSIS — Z9989 Dependence on other enabling machines and devices: Secondary | ICD-10-CM | POA: Diagnosis not present

## 2021-01-10 NOTE — Patient Instructions (Signed)
[email or web address]  Obesity-Hypoventilation Syndrome Obesity-hypoventilation syndrome (OHS) is a condition in which a person cannot efficiently move air in and out of the lungs (ventilate). This condition causes hypoventilation, which means the blood will have a buildup of carbon dioxideand a drop in oxygen levels. Key factors for OHS include having too much body fat, or obesity, and having high levels of awake daytime carbon dioxide (hypercapnia). OHS can increase the risk for:  Cor pulmonale, or right-sided heart failure.  Congestive heart failure.  Pulmonary hypertension, or high blood pressure in the arteries in the lungs.  Too many red blood cells in the body.  Disability or death. What are the causes? This condition may be caused by:  Being obese with a BMI (body mass index) greater than or equal to 30 kg/m2.  Having too much fat around the abdomen, chest, and neck.  The brain being unable to properly manage the high carbon dioxide and low oxygen levels.  Hormones made by fat cells. These hormones may interfere with breathing.  Sleep apnea. This is when breathing stops, pauses, or is shallow during sleep. What are the signs or symptoms? Symptoms of this condition include:  Feeling sleepy during the day.  Headaches. These may be worse in the morning.  Shortness of breath.  Snoring, choking, gasping, or trouble breathing during sleep.  Poor concentration or poor memory.  Mood changes or feeling irritable.  Depression. How is this diagnosed? This condition may be diagnosed by:  BMI measurement.  Blood tests to measure blood levels of serum bicarbonate, carbon dioxide, and oxygen.  Pulse oximetry to measure the amount of oxygen in your blood. This uses a small device that is placed on your finger, earlobe, or toe.  Polysomnogram, or sleep study, to check your breathing patterns and  levels of oxygen and carbon dioxide while you sleep. You may also have other tests, including:  A chest X-ray to rule out other breathing problems.  Lung tests, or pulmonary function tests, to rule out other breathing problems.  ECG (electrocardiogram) or echocardiogram to check for signs of heart failure. How is this treated? This condition may be treated with:  A device such as a continuous positive airway pressure (CPAP) machine or a bi-level positive airway pressure (BPAP) machine. These devices deliver pressure and sometimes oxygen to make breathing easier. A mask may be placed over your nose or mouth.  Oxygen if your blood oxygen levels are low.  A weight-loss program.  Bariatric, or weight-loss, surgery.  Tracheostomy. A tube is placed in the windpipe through the neck to help with breathing.   Follow these instructions at home: Medicines  Take over-the-counter and prescription medicines only as told by your health care provider.  Ask your health care provider what medicines are safe for you. You may be told to avoid medicines such as sedatives and narcotics. These can affect breathing and make OHS worse. Sleeping habits  If you are prescribed a CPAP or a BPAP machine, make sure you understand how to use it. Use your CPAP or BPAP machine only as told by your health care provider.  Try to get at least 8 hours of sleep every night. Eating and drinking  Eat foods that are high in fiber, such as beans, whole grains, and fresh fruits and vegetables.  Limit foods that are high in fat and processed sugars, such as fried or sweet foods.  Drink enough fluid to keep your urine pale yellow.  Do not drink alcohol if: ? Your  health care provider tells you not to drink. ? You are pregnant, may be pregnant, or are planning to become pregnant.   General instructions  Follow a diet and exercise plan that helps you reach and keep a healthy weight as told by your health care  provider.  Exercise regularly as told by your health care provider.  Do not use any products that contain nicotine or tobacco, such as cigarettes, e-cigarettes, and chewing tobacco. If you need help quitting, ask your health care provider.  Keep all follow-up visits as told by your health care provider. This is important.   Contact a health care provider if:  You develop new or worsening shortness of breath.  You are having trouble waking up or staying awake.  You are confused.  You have chest pain.  You have fast or irregular heartbeats.  You are dizzy or you faint.  You develop a cough.  You have a fever. Get help right away if: You have any symptoms of a stroke. "BE FAST" is an easy way to remember the main warning signs of a stroke:  B - Balance. Signs are dizziness, sudden trouble walking, or loss of balance.  E - Eyes. Signs are trouble seeing or a sudden change in vision.  F - Face. Signs are sudden weakness or numbness of the face, or the face or eyelid drooping on one side.  A - Arms. Signs are weakness or numbness in an arm. This happens suddenly and usually on one side of the body.  S - Speech. Signs are sudden trouble speaking, slurred speech, or trouble understanding what people say.  T - Time. Time to call emergency services. Write down what time symptoms started. You have other signs of a stroke, such as:  A sudden, severe headache with no known cause.  Nausea or vomiting.  Seizure. These symptoms may represent a serious problem that is an emergency. Do not wait to see if the symptoms will go away. Get medical help right away. Call your local emergency services (911 in the [place] Do not drive yourself to the hospital. If you ever feel like you may hurt yourself or others, or have thoughts about taking your own life, get help right away. Go to your nearest emergency department or:  Call your local emergency services (911 in the [place]  Call a suicide  crisis helpline, such as the [facility] at (614)517-3208. This is open 24 hours a day in the [place]  Text the Crisis Text Line at 909 429 2593 (in the [place].). Summary  Obesity-hypoventilation syndrome (OHS) causes hypoventilation, which means the blood will have a buildup of carbon dioxideand a drop in oxygen levels.  Key factors for OHS include having too much body fat, or obesity, and having high levels of awake daytime carbon dioxide (hypercapnia).  OHS can increase the risk for heart failure, pulmonary hypertension, disability, and death.  Follow your diet and exercise plan as told by your health care provider. This information is not intended to replace advice given to you by your health care provider. Make sure you discuss any questions you have with your health care provider. Document Revised: 09/10/2019 Document Reviewed: 09/10/2019 Elsevier Patient Education  2021 [place]. CPAP and BPAP Information CPAP and BPAP are methods that use air pressure to keep your airways open and to help you breathe well. CPAP and BPAP use different amounts of pressure. Your health care provider will tell you whether CPAP or BPAP would be more  helpful for you.  CPAP stands for "continuous positive airway pressure." With CPAP, the amount of pressure stays the same while you breathe in and out.  BPAP stands for "bi-level positive airway pressure." With BPAP, the amount of pressure will be higher when you breathe in (inhale) and lower when you breathe out(exhale). This allows you to take larger breaths. CPAP or BPAP may be used in the hospital, or your health care provider may want you to use it at home. You may need to have a sleep study before your health care provider can order a machine for you to use at home. Why are CPAP and BPAP treatments used? CPAP or BPAP can be helpful if you have:  Sleep apnea.  Chronic obstructive pulmonary disease (COPD).  Heart failure.  Medical  conditions that cause muscle weakness, including muscular dystrophy or amyotrophic lateral sclerosis (ALS).  Other problems that cause breathing to be shallow, weak, abnormal, or difficult. CPAP and BPAP are most commonly used for obstructive sleep apnea (OSA) to keep the airways from collapsing when the muscles relax during sleep. How is CPAP or BPAP administered? Both CPAP and BPAP are provided by a small machine with a flexible plastic tube that attaches to a plastic mask that you wear. Air is blown through the mask into your nose or mouth. The amount of pressure that is used to blow the air can be adjusted on the machine. Your health care provider will set the pressure setting and help you find the best mask for you. When should CPAP or BPAP be used? In most cases, the mask only needs to be worn during sleep. Generally, the mask needs to be worn throughout the night and during any daytime naps. People with certain medical conditions may also need to wear the mask at other times when they are awake. Follow instructions from your health care provider about when to use the machine. What are some tips for using the mask?  Because the mask needs to be snug, some people feel trapped or closed-in (claustrophobic) when first using the mask. If you feel this way, you may need to get used to the mask. One way to do this is to hold the mask loosely over your nose or mouth and then gradually apply the mask more snugly. You can also gradually increase the amount of time that you use the mask.  Masks are available in various types and sizes. If your mask does not fit well, talk with your health care provider about getting a different one. Some common types of masks include: ? Full face masks, which fit over the mouth and nose. ? Nasal masks, which fit over the nose. ? Nasal pillow or prong masks, which fit into the nostrils.  If you are using a mask that fits over your nose and you tend to breathe through  your mouth, a chin strap may be applied to help keep your mouth closed.  Some CPAP and BPAP machines have alarms that may sound if the mask comes off or develops a leak.  If you have trouble with the mask, it is very important that you talk with your health care provider about finding a way to make the mask easier to tolerate. Do not stop using the mask. There could be a negative impact to your health if you stop using the mask.   What are some tips for using the machine?  Place your CPAP or BPAP machine on a secure table or stand  near an electrical outlet.  Know where the on/off switch is on the machine.  Follow instructions from your health care provider about how to set the pressure on your machine and when you should use it.  Do not eat or drink while the CPAP or BPAP machine is on. Food or fluids could get pushed into your lungs by the pressure of the CPAP or BPAP.  For home use, CPAP and BPAP machines can be rented or purchased through home health care companies. Many different brands of machines are available. Renting a machine before purchasing may help you find out which particular machine works well for you. Your insurance may also decide which machine you may get.  Keep the CPAP or BPAP machine and attachments clean. Ask your health care provider for specific instructions. Follow these instructions at home:  Do not use any products that contain nicotine or tobacco, such as cigarettes, e-cigarettes, and chewing tobacco. If you need help quitting, ask your health care provider.  Keep all follow-up visits as told by your health care provider. This is important. Contact a health care provider if:  You have redness or pressure sores on your head, face, mouth, or nose from the mask or head gear.  You have trouble using the CPAP or BPAP machine.  You cannot tolerate wearing the CPAP or BPAP mask.  Someone tells you that you snore even when wearing your CPAP or BPAP. Get help right  away if:  You have trouble breathing.  You feel confused. Summary  CPAP and BPAP are methods that use air pressure to keep your airways open and to help you breathe well.  You may need to have a sleep study before your health care provider can order a machine for home use.  If you have trouble with the mask, it is very important that you talk with your health care provider about finding a way to make the mask easier to tolerate. Do not stop using the mask. There could be a negative impact to your health if you stop using the mask.  Follow instructions from your health care provider about when to use the machine. This information is not intended to replace advice given to you by your health care provider. Make sure you discuss any questions you have with your health care provider. Document Revised: 10/08/2019 Document Reviewed: 10/11/2019 Elsevier Patient Education  2021 [employer].

## 2021-01-10 NOTE — Progress Notes (Signed)
SLEEP MEDICINE CLINIC    Provider:  Larey Seat, MD  Primary Care Physician:  Practice, [place] [phone]     Referring Provider: [facility], [place],  [place] [phone]          Chief Complaint according to patient   Patient presents with:    . Kelsey Patient (Initial Visit)     Pt states she had a SS in 2014 in [place]. She purchased a CPAP online due to not able to afford through insurance. She has tried multiple tricks to attempt to have her mouth closed at night. Her machine is older [facility] unable to get a download from it.  She thinks her pressure is set around 11 cm water pressure. Has chronic sinusitis due to unhumidified CPAP use initially- used a refurbished / reconditioned machine since.  She used an airfit nasal pillow with frontal tubing, but changed to dreamwear- with chin strap and mouth guard.      HISTORY OF PRESENT ILLNESS:  K[place] H[place] is a 65 - year old Caucasian female patient and seen hereupon  referral on 01/10/2021 from [place] concern according to patient : "  I need to continue CPAP use, but my machine is very old- it works, I want to combine it with a dental device".     Kelsey Henson  has a past medical history of Arthritis, Atrial fibrillation ([place]), Dyslipidemia, Family history of brain cancer,  Gastric bypass status for obesity (04/27/2019), Morbid obesity still after  roux and Y surgery- lost 100 pounds, GERD (gastroesophageal reflux disease), History of kidney cancer, History of uterine cancer (2014), adenomatous colonic polyps (2013), Hypertension, Migraines, Morbid obesity ([place]), OSA on CPAP, Renal cancer ([place]) (2018), nephrectomy , now Single kidney, knee replacement surgery- post anesthesia she was told she needs a Sleep Study.   She has already lost 100 pounds and reportedly has 100 pounds more to loose.    The patient had the first sleep  study in the year 2014 in [place]  with a result of OSA.  Sleep relevant medical history: no Nocturia on CPAP, Obesity, Thyroid disease with palpitations.   Family medical /sleep history: NO other family member on CPAP with OSA, insomnia, sleep walkers.    Social history:  Patient is working as a Publishing rights manager, she lives with husband and cat and 2 dogs. The couple has  2 adult children,several 5 grandchildren.  The patient currently works in daytime , office hours.  Tobacco use; none .  ETOH use ; socially, 1 a month.  Caffeine intake in form of Coffee( 1 cup of coffee in AM ) Soda( decaff) Tea ( decaff) no energy drinks. Regular exercise in form of biking, summer in the pool. .      Sleep habits are as follows: The patient's dinner time is between 7 PM. The patient goes to bed at 10 PM and continues to sleep for 7 hours, rarely wakes for any bathroom breaks.   The preferred sleep position is left side, with the support of 1-2 pillows, one between the knees. Dreams are reportedly infrequently, mostly in AM when woken by the cat.    6.45 AM is the usual rise time. The patient wakes up at 6.45 with an alarm.   She reports  feeling not as refreshed or restored in AM, with symptoms such as dry mouth, had nosebleeds, uses mouthguard, ,  morning headaches and migraines, and residual fatigue.  Naps are taken infrequently, on weekends,  lasting from 30-60 minutes and are more refreshing than nocturnal sleep.    Review of Systems: Out of a complete 14 system review, the patient complains of only the following symptoms, and all other reviewed systems are negative.:  Fatigue, sleepiness , snoring, fragmented sleep by joint pain, not nocturia. Dry mouth.   How likely are you to doze in the following situations: 0 = not likely, 1 = slight chance, 2 = moderate chance, 3 = high chance   Sitting and Reading? Watching Television? Sitting inactive in a public place (theater or meeting)? As a passenger in a  car for an hour without a break? Lying down in the afternoon when circumstances permit? Sitting and talking to someone? Sitting quietly after lunch without alcohol? In a car, while stopped for a few minutes in traffic?   Total = 6/ 24 points on CPAP, was at 15/24 before CPAP.   FSS endorsed at 44/ 63 points.   Social History   Socioeconomic History  . Marital status: Married    Spouse name: Doren Custard  . Number of children: 2  . Years of education: Not on file  . Highest education level: Not on file  Occupational History  . Not on file  Tobacco Use  . Smoking status: Never Smoker  . Smokeless tobacco: Never Used  Vaping Use  . Vaping Use: Never used  Substance and Sexual Activity  . Alcohol use: Yes    Comment: socially  . Drug use: Never  . Sexual activity: Not on file  Other Topics Concern  . 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  [place]         2 daughters      No alcohol tobacco or drug use   Social Determinants of Health   Financial Resource Strain: Not on file  Food Insecurity: Not on file  Transportation Needs: Not on file  Physical Activity: Not on file  Stress: Not on file  Social Connections: Not on file    Family History  Problem Relation Age of Onset  . Lung cancer Mother 30  . Esophageal cancer Father 77  . Prostate cancer Father   . Brain cancer Sister 51       d. 64; Glioblastoma  . Hepatitis Brother        d. 25  . Stomach cancer Maternal Uncle   . Parkinson's disease Paternal Uncle   . Diabetes Maternal Grandmother   . Heart attack Maternal Grandfather   . Other Paternal Grandfather 56       killed during a mugging  . Multiple myeloma Sister     Past Medical History:  Diagnosis Date  . Arthritis   . Atrial  fibrillation (HCC)    Transient, isolated  . Dyslipidemia   . Family history of brain cancer   . Family history of prostate cancer   . Family history of stomach cancer   . Gastric bypass status for obesity 04/27/2019  . GERD (gastroesophageal reflux disease)   . History of kidney cancer   . History of uterine cancer   . Hx of adenomatous colonic polyps 2013  . Hypertension   . Migraines   . Morbid  obesity ([place])   . OSA on CPAP   . Renal cancer ([place]) 2018   rt nephrectomy  . Single kidney   . Sleep apnea    wears CPAP  . Uterine cancer [facility]) 2014   hysterectomy    Past Surgical History:  Procedure Laterality Date  . ABDOMINAL HYSTERECTOMY     Due to Cancer, complete hyst  . BUNIONECTOMY     left foot  . CHOLECYSTECTOMY    . COLONOSCOPY    . COLONOSCOPY WITH PROPOFOL N/A 08/13/2019   Procedure: COLONOSCOPY WITH PROPOFOL;  Surgeon: Gatha Mayer, MD;  Location: [facility];  Service: Endoscopy;  Laterality: N/A;  . FRACTURE SURGERY Right    Arm repaired with plate but plate is now removed   . GALLBLADDER SURGERY  11/13/2016  . GASTRIC ROUX-EN-Y N/A 04/27/2019   Procedure: LAPAROSCOPIC ROUX-EN-Y GASTRIC BYPASS WITH UPPER ENDOSCOPY, ERAS Pathway;  Surgeon: [name], MD;  Location: [facility] ORS;  Service: General;  Laterality: N/A;  . KIDNEY CYST REMOVAL Right 11/13/2016  . NEPHRECTOMY Right 2018  . POLYPECTOMY  08/13/2019   Procedure: POLYPECTOMY;  Surgeon: Gatha Mayer, MD;  Location: [facility];  Service: Endoscopy;;  . REPLACEMENT TOTAL KNEE Right      Current Outpatient Medications on File Prior to Visit  Medication Sig Dispense Refill  . Calcium-Vitamin D-Vitamin K ([facility] CALCIUM SOFT CHEWS) 650-12.5-40 MG-MCG-MCG CHEW Chew 1 each by mouth 3 (three) times daily.     . metoprolol succinate (TOPROL-XL) 25 MG 24 hr tablet Take 1 tablet by mouth daily.    . metoprolol tartrate (LOPRESSOR) 25 MG tablet Take 25 mg by mouth 2 (two) times daily.    . Multiple  Vitamins-Minerals (BARIATRIC FUSION PO) Take 1 tablet by mouth every morning.    [name] omeprazole (PRILOSEC) 40 MG capsule TAKE 1 CAPSULE (40 MG TOTAL) BY MOUTH DAILY BEFORE BREAKFAST. OPEN CAPSULE IF POSSIBLE 90 capsule 0  . pravastatin (PRAVACHOL) 40 MG tablet Take 40 mg by mouth at bedtime.     No current facility-administered medications on file prior to visit.    No Known Allergies  Physical exam:  Today's Vitals   01/10/21 0850  BP: 132/68  Pulse: (!) 57  Weight: (!) 309 lb (140.2 kg)  Height: '5\' 5"'  (1.651 m)   Body mass index is 51.42 kg/m.   Wt Readings from Last 3 Encounters:  01/10/21 (!) 309 lb (140.2 kg)  04/19/20 299 lb 11.2 oz (135.9 kg)  01/13/20 (!) 311 lb 11.2 oz (141.4 kg)     Ht Readings from Last 3 Encounters:  01/10/21 '5\' 5"'  (1.651 m)  04/19/20 '5\' 6"'  (1.676 m)  01/13/20 '5\' 5"'  (1.651 m)      General: The patient is awake, alert and appears not in acute distress. The patient is well groomed. Head: Normocephalic, atraumatic.  Neck is supple. Mallampati 1-2,  neck circumference:16 inches . Nasal airflow patent.  Retrognathia is not seen. Reports postnasal drip and GERD in the past, before bariatric surgery.  Dental status: biological  Cardiovascular:  Regular rate and cardiac rhythm by pulse,  without distended neck veins. Respiratory: Lungs are clear to auscultation.  Skin:  Without evidence of ankle edema, or rash. Trunk: The patient's posture is erect.   Neurologic exam : The patient is awake and alert, oriented to place and time.   Memory subjective described as intact.  Attention span & concentration ability appears normal.  Speech is fluent,  without  dysarthria, dysphonia or  aphasia.  Mood and affect are appropriate.   Cranial nerves: no loss of smell or taste reported  Pupils are equal and briskly reactive to light. Funduscopic exam deferred.   Extraocular movements in vertical and horizontal planes were intact and without nystagmus. No  Diplopia. Visual fields by finger perimetry are intact. Hearing was intact to soft voice and finger rubbing. Facial sensation intact to fine touch. Facial motor strength is symmetric and tongue and uvula move midline.  Neck ROM : rotation, tilt and flexion extension were normal for age and shoulder shrug was symmetrical.    Motor exam:  Symmetric bulk, tone and ROM.   Normal tone without cog wheeling, symmetric grip strength .   Sensory:  Fine touch,and vibration were tested on the upper extremities,  and  normal.  Absent vibration sense in both knees and below.  Proprioception tested in the upper extremities was normal.   Coordination: Rapid alternating movements in the fingers/hands were of normal speed.  The Finger-to-nose maneuver was intact without evidence of ataxia, dysmetria or tremor.   Gait and station: Patient could rise unassisted from a seated position, walked without assistive device.  Stance is of normal width/ base.  She walks with knee pain, visibly avoiding weight baring on the right  Toe and heel walk were deferred.  Deep tendon reflexes: in the  upper  extremities are symmetric and intact.  Babinski response was deferred .       After spending a total time of 45  minutes face to face and additional time for physical and neurologic examination, review of laboratory studies,  personal review of imaging studies, reports and results of other testing and review of referral information / records as far as provided in visit, I have established the following assessments:  Mrs. Zaldivar has an interesting history of her medical past and especially her sleep medical needs.  She was diagnosed with sleep apnea after she had trouble waking up from 2 different anesthesia events and was tested and apparently positive for sleep apnea.  She was very obese, super-obese at the time,and later had undergone bariatric surgery.   She has always been told that she has obstructive sleep apnea or  has assumed so.  She purchased a CPAP online that was refurbished and she has used the same machine now for 8 years or so.  The machine itself is in [place] in case that determines at the age of the machine is 2 to 64 years old.  She has used the machine in conjunction with the chin guard and with a mouthguard.  She still had a lot of dry mouth and her initial machine did not have a humidifier visit.  She is happy with the current machine which is also a very durable example, but she needs a mouthguard made to measure and for that to get coverage from health insurance, she will need a sleep study confirming that she has sleep apnea so that referral specialist will cover the cost.  She is seeing a sleep dentist [facility].    My Plan is to proceed with:  1) Sleep study with Titration, CPAP can be reset. We haven't gotten a reports of CPAP data. She  believes its set at 11 cm water, without EPR.  If SPLIT is not allowed by insurance we will just need to do a HST.  [place], [place] is her home.   2) Once apnea is established, we will send a report to her dentist  for coverage of oral mandibular advance.  3) she can get Kelsey supplies for this machine through [place] unless it will stop working, then we need to order a Kelsey device.  Uses nasal pillows.  I would like to thank Practice, [facility],  [place] [phone] for allowing me to meet with and to take care of this pleasant patient.   I plan to follow up either personally or through our NP within 2-4  month.   CC: I will share my notes with PCP.[name]  Electronically signed by: Larey Seat, MD 01/10/2021 9:10 AM  [facility] and [employer] Board certified by The [employer] of Sleep Medicine and Diplomate of the [employer] of Sleep Medicine. Board certified In Neurology through the [place], Fellow of the [employer] of Neurology. Medical Director of [employer].

## 2021-01-18 DIAGNOSIS — Z859 Personal history of malignant neoplasm, unspecified: Secondary | ICD-10-CM | POA: Diagnosis not present

## 2021-01-18 DIAGNOSIS — K420 Umbilical hernia with obstruction, without gangrene: Secondary | ICD-10-CM | POA: Diagnosis not present

## 2021-01-18 DIAGNOSIS — K602 Anal fissure, unspecified: Secondary | ICD-10-CM | POA: Diagnosis not present

## 2021-01-31 ENCOUNTER — Ambulatory Visit (INDEPENDENT_AMBULATORY_CARE_PROVIDER_SITE_OTHER): Payer: BC Managed Care – PPO | Admitting: Neurology

## 2021-01-31 ENCOUNTER — Other Ambulatory Visit: Payer: Self-pay

## 2021-01-31 DIAGNOSIS — G4733 Obstructive sleep apnea (adult) (pediatric): Secondary | ICD-10-CM

## 2021-01-31 DIAGNOSIS — E662 Morbid (severe) obesity with alveolar hypoventilation: Secondary | ICD-10-CM

## 2021-01-31 DIAGNOSIS — Z9989 Dependence on other enabling machines and devices: Secondary | ICD-10-CM

## 2021-01-31 DIAGNOSIS — E669 Obesity, unspecified: Secondary | ICD-10-CM

## 2021-02-01 NOTE — Progress Notes (Signed)
[facility] (Watch PAT)  STUDY DATE: 02/01/21  DOB: 1955-12-26  MRN: [id number]  ORDERING CLINICIAN: Larey Seat, MD   REFERRING CLINICIAN: [facility], [facility]   CLINICAL INFORMATION/HISTORY: 01-10-2021. [name] has a medical history of Arthritis, Atrial fibrillation ([place]), Dyslipidemia, Family history of brain cancer,  Gastric bypass status for obesity (04/27/2019), Morbid obesity still after  roux and Y surgery- lost 100 pounds,  GERD (gastroesophageal reflux disease), History of kidney cancer, History of uterine cancer (2014), adenomatous colonic polyps (2013), Thyroid disease, Hypertension, Migraines, Morbid obesity ([place]), OSA on CPAP, CPAP caused sinusitis-( 2014 , [place] ) , Renal cancer ([place]) (2018), nephrectomy , now Single kidney, knee replacement surgery-  post anesthesia she was told she needs a Sleep Study to get a new CPAP machine and wants to know if a dental device may also be useful for her. She is often woken by joint pain. Wakes with a dry mouth.    Epworth sleepiness score: 6/24.  FSS at 44/63 points.   BMI: 51.4 kg/m  Neck Circumference: 16 "  FINDINGS:   Total Record Time (hours, min): 6 h 25 min  Total Sleep Time (hours, min):  5 h 40 min   Percent REM (%):    7.63 %   Calculated pAHI (per hour): 40.9       REM pAHI: Insufficient REM    Supine AHI: 38.0   Oxygen Saturation (%) Mean: 93  Minimum oxygen saturation (%):        82   O2 Saturation Range (%): 82-99  O2Saturation (minutes) <=88%: 0.2 min   Pulse Mean (bpm):    93  Pulse Range (37-105)   IMPRESSION: This HST confirmed the presence of severe  OSA (obstructive sleep apnea) at an AHI of 40.9/h and a highly variable pulse range. Heart rhythm can not be evaluated by HST. Primary snoring was present but no hypoxemia.    RECOMMENDATION:  CPAP is definitely the main treatment option for severe OSA and snoring. The patient will receive an  autotitration device with a setting from 6-18 cm water, 2 cm EPR and mask of her choice , with heated humidification included.  I will encourage sleeping on the left side as the patient's AHI was lower in that sleep position.    INTERPRETING PHYSICIAN:  Larey Seat, MD    [facility] and [facility] Board certified by The [employer] of Sleep Medicine and Diplomate of the [employer] of Sleep Medicine. Board certified In Neurology through the [place], Fellow of the [employer] of Neurology. Medical Director of [employer].  Sleep Summary  Oxygen Saturation Statistics   Start Study Time: End Study Time: Total Recording Time:          10:12:58 PM 4:38:09 AM 6 h, 25 min  Total Sleep Time % REM of Sleep Time:  5 h, 40 min  7.6    Mean: 93 Minimum: 82 Maximum: 99  Mean of Desaturations Nadirs (%):   92  Oxygen Desatur. %:  4-9 10-20 >20 Total  Events Number Total   123  2 98.4 1.6  0 0.0  125 100.0  Oxygen Saturation: <90 <=88 <85 <80 <70  Duration (minutes): Sleep % 0.7 0.2 0.2 0.0 0.0 0.0 0.0 0.0 0.0 0.0     Respiratory Indices      Total Events REM NREM All Night  pRDI: pAHI 3%: ODI 4%: pAHIc 3%: % CSR: pAHI  4%:  254  231 125   16 0.0 159 N/A N/A N/A N/A N/A N/A N/A N/A 45.0 40.9 22.2 2.8  28.2       Pulse Rate Statistics during Sleep (BPM)      Mean: 65 Minimum: 37 Maximum: 105        Body Position Statistics  Position Supine Prone Right Left Non-Supine  Sleep (min) 214.7 0.0 84.5 41.5 126.0  Sleep % 63.0 0.0 24.8 12.2 37.0  pRDI 43.3 N/A 60.1 23.1 47.9  pAHI 3% 38.0 N/A 58.7 20.2 46.0  ODI 4% 22.2 N/A 30.1 5.8 22.0     Snoring Statistics Snoring Level (dB) >40 >50 >60 >70 >80 >Threshold (45)  Sleep (min) 29.6 2.3 0.5 0.0 0.0 3.6  Sleep % 8.7 0.7 0.1 0.0 0.0 1.1    Mean: 40 dB

## 2021-02-20 DIAGNOSIS — C641 Malignant neoplasm of right kidney, except renal pelvis: Secondary | ICD-10-CM | POA: Diagnosis not present

## 2021-02-20 DIAGNOSIS — E669 Obesity, unspecified: Secondary | ICD-10-CM | POA: Insufficient documentation

## 2021-02-20 DIAGNOSIS — Z9989 Dependence on other enabling machines and devices: Secondary | ICD-10-CM | POA: Insufficient documentation

## 2021-02-20 DIAGNOSIS — E662 Morbid (severe) obesity with alveolar hypoventilation: Secondary | ICD-10-CM | POA: Insufficient documentation

## 2021-02-20 DIAGNOSIS — G4733 Obstructive sleep apnea (adult) (pediatric): Secondary | ICD-10-CM | POA: Insufficient documentation

## 2021-02-20 NOTE — Addendum Note (Signed)
Addended by: Larey Seat on: 02/20/2021 06:44 PM   Modules accepted: Orders

## 2021-02-20 NOTE — Procedures (Signed)
[facility] [place] (Watch PAT)  STUDY DATE: 02/01/21  DOB: 05-15-1956  MRN: [id number]  ORDERING CLINICIAN: Larey Seat, MD   REFERRING CLINICIAN: Practice, [facility]   CLINICAL INFORMATION/HISTORY: 01-10-2021. [place] has a medical history of Arthritis, Atrial fibrillation ([place]), Dyslipidemia, Family history of brain cancer,  Gastric bypass status for obesity (04/27/2019), Morbid obesity still after  roux and Y surgery- lost 100 pounds,  GERD (gastroesophageal reflux disease), History of kidney cancer, History of uterine cancer (2014), adenomatous colonic polyps (2013), Thyroid disease, Hypertension, Migraines, Morbid obesity ([place]), OSA on CPAP, CPAP caused sinusitis-( 2014 , [facility] ) , Renal cancer ([place]) (2018), nephrectomy , now Single kidney, knee replacement surgery-  post anesthesia she was told she needs a Sleep Study to get a new CPAP machine and wants to know if a dental device may also be useful for her. She is often woken by joint pain. Wakes with a dry mouth.    Epworth sleepiness score: 6/24.  FSS at 44/63 points.   BMI: 51.4 kg/m  Neck Circumference: 16 "  FINDINGS:   Total Record Time (hours, min): 6 h 25 min  Total Sleep Time (hours, min):  5 h 40 min   Percent REM (%):    7.63 %   Calculated pAHI (per hour): 40.9       REM pAHI: Insufficient REM    Supine AHI: 38.0   Oxygen Saturation (%) Mean: 93  Minimum oxygen saturation (%):        82   O2 Saturation Range (%): 82-99  O2Saturation (minutes) <=88%: 0.2 min   Pulse Mean (bpm):    93  Pulse Range (37-105)   IMPRESSION: This HST confirmed the presence of severe  OSA (obstructive sleep apnea) at an AHI of 40.9/h and a highly variable pulse range. Heart rhythm can not be evaluated by HST. Primary snoring was present but no hypoxemia.    RECOMMENDATION:  CPAP is definitely the main treatment option for severe OSA and snoring. The patient will receive an  autotitration device with a setting from 6-18 cm water, 2 cm EPR and mask of her choice , with heated humidification included.  I will encourage sleeping on the left side as the patient's AHI was lower in that sleep position.    INTERPRETING PHYSICIAN:  Larey Seat, MD    [facility] and [facility] Sleep Board certified by The [employer] of Sleep Medicine and Diplomate of the [employer] of Sleep Medicine. Board certified In Neurology through the [place], Fellow of the [employer] of Neurology. Medical Director of [employer].  Sleep Summary  Oxygen Saturation Statistics   Start Study Time: End Study Time: Total Recording Time:          10:12:58 PM 4:38:09 AM 6 h, 25 min  Total Sleep Time % REM of Sleep Time:  5 h, 40 min  7.6    Mean: 93 Minimum: 82 Maximum: 99  Mean of Desaturations Nadirs (%):   92  Oxygen Desatur. %:  4-9 10-20 >20 Total  Events Number Total   123  2 98.4 1.6  0 0.0  125 100.0  Oxygen Saturation: <90 <=88 <85 <80 <70  Duration (minutes): Sleep % 0.7 0.2 0.2 0.0 0.0 0.0 0.0 0.0 0.0 0.0     Respiratory Indices      Total Events REM NREM All Night  pRDI: pAHI 3%: ODI 4%: pAHIc 3%: % CSR: pAHI 4%:  254  231 125   16 0.0 159 N/A N/A N/A N/A N/A N/A N/A N/A 45.0 40.9 22.2 2.8  28.2       Pulse Rate Statistics during Sleep (BPM)      Mean: 65 Minimum: 37 Maximum: 105        Body Position Statistics  Position Supine Prone Right Left Non-Supine  Sleep (min) 214.7 0.0 84.5 41.5 126.0  Sleep % 63.0 0.0 24.8 12.2 37.0  pRDI 43.3 N/A 60.1 23.1 47.9  pAHI 3% 38.0 N/A 58.7 20.2 46.0  ODI 4% 22.2 N/A 30.1 5.8 22.0     Snoring Statistics Snoring Level (dB) >40 >50 >60 >70 >80 >Threshold (45)  Sleep (min) 29.6 2.3 0.5 0.0 0.0 3.6  Sleep % 8.7 0.7 0.1 0.0 0.0 1.1

## 2021-02-20 NOTE — Progress Notes (Signed)
IMPRESSION: This HST confirmed the presence of severe  OSA (obstructive sleep apnea) at an AHI of 40.9/h and a highly variable pulse range. Heart rhythm can not be evaluated by HST. Primary snoring was present but no hypoxemia.    RECOMMENDATION:  CPAP is definitely the main treatment option for severe OSA and snoring.  This atrial fibrillation patient will receive an autotitration device with a setting from 6-18 cm water, 2 cm EPR and mask of her choice , with heated humidification included.  I will encourage sleeping on the left side as the patient's AHI was lower in that sleep position.    INTERPRETING PHYSICIAN:  Larey Seat, MD  [facility] and [facility]

## 2021-02-21 ENCOUNTER — Telehealth: Payer: Self-pay

## 2021-02-21 NOTE — Telephone Encounter (Signed)
-----   Message from Larey Seat, MD sent at 02/20/2021  6:44 PM EDT ----- IMPRESSION: This HST confirmed the presence of severe  OSA (obstructive sleep apnea) at an AHI of 40.9/h and a highly variable pulse range. Heart rhythm can not be evaluated by HST. Primary snoring was present but no hypoxemia.    RECOMMENDATION:  CPAP is definitely the main treatment option for severe OSA and snoring.  This atrial fibrillation patient will receive an autotitration device with a setting from 6-18 cm water, 2 cm EPR and mask of her choice , with heated humidification included.  I will encourage sleeping on the left side as the patient's AHI was lower in that sleep position.    INTERPRETING PHYSICIAN:  Larey Seat, MD  [facility] and [facility] Sleep

## 2021-02-21 NOTE — Telephone Encounter (Signed)
I called the pt and we discussed the result of her sleep study. Pt is agreeable to order being for her new autopap, but sts she may not be able to afford a new machine at this time.  Pt sts her current machine is still working well for her and she may have to continue using it if her new machine copayment is too high. She is agreeable to order being sent to [facility]. I provided her with the number to [facility] as well and advised they could assist with getting her older machine registered/supply reorder.  Pt also sts she has been in touch with her local dentist and has requested an order form for oral appliance to help keep her jaw from falling open while on CPAP.  Pt was provided with our office phone/fax and will have dentist fax to our office for review.   Order has been sent to [facility]- pt will call us back once she knows if she will pursue starting the new machine, she understands we will need to see her back within 31-90 days is she does pursue this option.

## 2021-02-27 DIAGNOSIS — C641 Malignant neoplasm of right kidney, except renal pelvis: Secondary | ICD-10-CM | POA: Diagnosis not present

## 2021-02-27 DIAGNOSIS — K5730 Diverticulosis of large intestine without perforation or abscess without bleeding: Secondary | ICD-10-CM | POA: Diagnosis not present

## 2021-03-05 DIAGNOSIS — C641 Malignant neoplasm of right kidney, except renal pelvis: Secondary | ICD-10-CM | POA: Diagnosis not present

## 2021-03-30 ENCOUNTER — Other Ambulatory Visit: Payer: Self-pay | Admitting: Internal Medicine

## 2021-04-10 ENCOUNTER — Telehealth: Payer: Self-pay | Admitting: Neurology

## 2021-04-10 NOTE — Telephone Encounter (Signed)
Called the patient back.  Patient states that she discussed CPAP financials with [facility] and was unable to afford the machine through them.  Her insurance would help but did not cover an office.  Patient contacted CPAP assistance program through [email or web address]. There were able to redirect to [place] [employer] to help provide with assistance.  She advised all she needed was the prescription.  Patient provided email for me to forward the prescription to her.  Advised the patient that once she is set up we would just need to make sure that we follow-up with her at least once a year for CPAP supplies and making sure machine is working appropriately.  Patient verbalized understanding.  Patient was appreciative for the call back.

## 2021-04-10 NOTE — Telephone Encounter (Signed)
Pt called needing a prescription for her to get a cpap so that she can give it to the cpap assistance program. Please advise.

## 2021-05-09 DIAGNOSIS — G4733 Obstructive sleep apnea (adult) (pediatric): Secondary | ICD-10-CM | POA: Diagnosis not present

## 2021-06-09 DIAGNOSIS — G4733 Obstructive sleep apnea (adult) (pediatric): Secondary | ICD-10-CM | POA: Diagnosis not present

## 2021-06-14 DIAGNOSIS — I700 Atherosclerosis of aorta: Secondary | ICD-10-CM | POA: Diagnosis not present

## 2021-06-14 DIAGNOSIS — N1831 Chronic kidney disease, stage 3a: Secondary | ICD-10-CM | POA: Diagnosis not present

## 2021-06-14 DIAGNOSIS — Z23 Encounter for immunization: Secondary | ICD-10-CM | POA: Diagnosis not present

## 2021-06-14 DIAGNOSIS — E785 Hyperlipidemia, unspecified: Secondary | ICD-10-CM | POA: Diagnosis not present

## 2021-06-14 DIAGNOSIS — I10 Essential (primary) hypertension: Secondary | ICD-10-CM | POA: Diagnosis not present

## 2021-06-14 DIAGNOSIS — Z9884 Bariatric surgery status: Secondary | ICD-10-CM | POA: Diagnosis not present

## 2021-06-27 ENCOUNTER — Ambulatory Visit (INDEPENDENT_AMBULATORY_CARE_PROVIDER_SITE_OTHER): Payer: BC Managed Care – PPO | Admitting: Family Medicine

## 2021-06-27 ENCOUNTER — Encounter: Payer: Self-pay | Admitting: Family Medicine

## 2021-06-27 VITALS — BP 133/81 | HR 75 | Ht 65.0 in | Wt 317.0 lb

## 2021-06-27 DIAGNOSIS — G4733 Obstructive sleep apnea (adult) (pediatric): Secondary | ICD-10-CM

## 2021-06-27 DIAGNOSIS — Z9989 Dependence on other enabling machines and devices: Secondary | ICD-10-CM | POA: Diagnosis not present

## 2021-06-27 NOTE — Patient Instructions (Addendum)
Please continue using your CPAP regularly. While your insurance requires that you use CPAP at least 4 hours each night on 70% of the nights, I recommend, that you not skip any nights and use it throughout the night if you can. Getting used to CPAP and staying with the treatment long term does take time and patience and discipline. Untreated obstructive sleep apnea when it is moderate to severe can have an adverse impact on cardiovascular health and raise her risk for heart disease, arrhythmias, hypertension, congestive heart failure, stroke and diabetes. Untreated obstructive sleep apnea causes sleep disruption, nonrestorative sleep, and sleep deprivation. This can have an impact on your day to day functioning and cause daytime sleepiness and impairment of cognitive function, memory loss, mood disturbance, and problems focussing. Using CPAP regularly can improve these symptoms.   Follow up in 1 year   DME: [facility] Phone: [phone], press option 1

## 2021-06-27 NOTE — Progress Notes (Signed)
PATIENT: Kelsey Henson DOB: 06/08/1956  REASON FOR VISIT: follow up HISTORY FROM: patient  Chief Complaint  Patient presents with   Follow-up    RM 11, alone. Last seen 01/10/21. CPAP follow up. No new sx.      HISTORY OF PRESENT ILLNESS:  06/27/21 ALL:  Kelsey Henson is a 65 y.o. female here today for follow up for OSA on CPAP.  She was seen in consult with Dr Brett Fairy 12/2020. She had been using a very old CPAP machine with a dental device for hx of OSA. Repeat sleep study 01/2021 showed severe OSA with total AHI of 40.9/hr, no hypoxia. New autoPAP ordered with setting of 6-18cmH20. Set up date 05/09/2021. She is doing well. Apnea well managed. She is trying to find a good mask. She was using FFM but is a mouth breather. Now using nasal pillow. She is using sleep tape to help close her mouth at night. No longer using homemade mouth guard. She is working with her dentist to have a custom oral appliance to keep mouth closed. Chin strap not effective.     HISTORY: (copied from Dr Dohmeier's previous note)  Kelsey Henson is a 71 - year old Caucasian female patient and seen hereupon  referral on 01/10/2021 from [place] concern according to patient : "  I need to continue CPAP use, but my machine is very old- it works, I want to combine it with a dental device".    Kelsey Henson  has a past medical history of Arthritis, Atrial fibrillation ([place]), Dyslipidemia, Family history of brain cancer,  Gastric bypass status for obesity (04/27/2019), Morbid obesity still after  roux and Y surgery- lost 100 pounds, GERD (gastroesophageal reflux disease), History of kidney cancer, History of uterine cancer (2014), adenomatous colonic polyps (2013), Hypertension, Migraines, Morbid obesity ([place]), OSA on CPAP, Renal cancer ([place]) (2018), nephrectomy , now Single kidney, knee replacement surgery- post anesthesia she was told she needs a Sleep Study.    She has already lost 100 pounds and  reportedly has 100 pounds more to loose.    The patient had the first sleep study in the year 2014 in [place]  with a result of OSA. Sleep relevant medical history: no Nocturia on CPAP, Obesity, Thyroid disease with palpitations. Family medical /sleep history: NO other family member on CPAP with OSA, insomnia, sleep walkers.    Social history:  Patient is working as a Publishing rights manager, she lives with husband and cat and 2 dogs. The couple has  2 adult children,several 5 grandchildren.  The patient currently works in daytime , office hours.  Tobacco use; none .  ETOH use ; socially, 1 a month.  Caffeine intake in form of Coffee( 1 cup of coffee in AM ) Soda( decaff) Tea ( decaff) no energy drinks. Regular exercise in form of biking, summer in the pool. .      Sleep habits are as follows: The patient's dinner time is between 7 PM. The patient goes to bed at 10 PM and continues to sleep for 7 hours, rarely wakes for any bathroom breaks.   The preferred sleep position is left side, with the support of 1-2 pillows, one between the knees. Dreams are reportedly infrequently, mostly in AM when woken by the cat. 6.45 AM is the usual rise time. The patient wakes up at 6.45 with an alarm.   She reports  feeling not as refreshed or restored in AM, with  symptoms such as dry mouth, had nosebleeds, uses mouthguard, , morning headaches and migraines, and residual fatigue.  Naps are taken infrequently, on weekends,  lasting from 30-60 minutes and are more refreshing than nocturnal sleep.    REVIEW OF SYSTEMS: Out of a complete 14 system review of symptoms, the patient complains only of the following symptoms, fatigue and all other reviewed systems are negative.  ESS: 6/24, 16/24 off CPAP   ALLERGIES: No Known Allergies  HOME MEDICATIONS: Outpatient Medications Prior to Visit  Medication Sig Dispense Refill   Calcium-Vitamin D-Vitamin K ([employer] CALCIUM SOFT CHEWS) 650-12.5-40 MG-MCG-MCG CHEW Chew 1 each by mouth  3 (three) times daily.      metoprolol succinate (TOPROL-XL) 25 MG 24 hr tablet Take 1 tablet by mouth daily.     metoprolol tartrate (LOPRESSOR) 25 MG tablet Take 25 mg by mouth 2 (two) times daily.     Multiple Vitamins-Minerals (BARIATRIC FUSION PO) Take 1 tablet by mouth every morning.     omeprazole (PRILOSEC) 40 MG capsule TAKE 1 CAPSULE (40 MG TOTAL) BY MOUTH DAILY BEFORE BREAKFAST. OPEN CAPSULE IF POSSIBLE 90 capsule 0   pravastatin (PRAVACHOL) 40 MG tablet Take 40 mg by mouth at bedtime.     No facility-administered medications prior to visit.    PAST MEDICAL HISTORY: Past Medical History:  Diagnosis Date   Arthritis    Atrial fibrillation ([place])    Transient, isolated   Dyslipidemia    Family history of brain cancer    Family history of prostate cancer    Family history of stomach cancer    Gastric bypass status for obesity 04/27/2019   GERD (gastroesophageal reflux disease)    History of kidney cancer    History of uterine cancer    Hx of adenomatous colonic polyps 2013   Hypertension    Migraines    Morbid obesity ([place])    OSA on CPAP    Renal cancer ([place]) 2018   rt nephrectomy   Single kidney    Sleep apnea    wears CPAP   Uterine cancer ([place]) 2014   hysterectomy    PAST SURGICAL HISTORY: Past Surgical History:  Procedure Laterality Date   ABDOMINAL HYSTERECTOMY     Due to Cancer, complete hyst   BUNIONECTOMY     left foot   CHOLECYSTECTOMY     COLONOSCOPY     COLONOSCOPY WITH PROPOFOL N/A 08/13/2019   Procedure: COLONOSCOPY WITH PROPOFOL;  Surgeon: Gatha Mayer, MD;  Location: [facility];  Service: Endoscopy;  Laterality: N/A;   FRACTURE SURGERY Right    Arm repaired with plate but plate is now removed    GALLBLADDER SURGERY  11/13/2016   GASTRIC ROUX-EN-Y N/A 04/27/2019   Procedure: LAPAROSCOPIC ROUX-EN-Y GASTRIC BYPASS WITH UPPER ENDOSCOPY, ERAS Pathway;  Surgeon: [name], MD;  Location: [facility];  Service: General;  Laterality: N/A;    KIDNEY CYST REMOVAL Right 11/13/2016   NEPHRECTOMY Right 2018   POLYPECTOMY  08/13/2019   Procedure: POLYPECTOMY;  Surgeon: Gatha Mayer, MD;  Location: [facility];  Service: Endoscopy;;   REPLACEMENT TOTAL KNEE Right     FAMILY HISTORY: Family History  Problem Relation Age of Onset   Lung cancer Mother 58   Esophageal cancer Father 23   Prostate cancer Father    Brain cancer Sister 22       d. 18; Glioblastoma   Hepatitis Brother        d. 85   Stomach cancer  Maternal Uncle    Parkinson's disease Paternal Uncle    Diabetes Maternal Grandmother    Heart attack Maternal Grandfather    Other Paternal Grandfather 43       killed during a mugging   Multiple myeloma Sister     SOCIAL HISTORY: Social History   Socioeconomic History   Marital status: Married    Spouse name: Doren Custard   Number of children: 2   Years of education: Not on file   Highest education level: Not on file  Occupational History   Not on file  Tobacco Use   Smoking status: Never   Smokeless tobacco: Never  Vaping Use   Vaping Use: Never used  Substance and Sexual Activity   Alcohol use: Yes    Comment: socially   Drug use: Never   Sexual activity: Not on file  Other Topics Concern   Not on file  Social History Narrative   She is married her husband has had renal transplants, 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  She moved to [place] from the [place], [place] area         2 daughters      No alcohol tobacco or drug use   Social Determinants of Health   Financial Resource Strain: Not on file  Food Insecurity: Not on file  Transportation Needs: Not on file  Physical Activity: Not on file  Stress: Not on file  Social Connections: Not on file  Intimate Partner Violence: Not on file     PHYSICAL EXAM  Vitals:   06/27/21 1519  BP: 133/81  Pulse: 75  SpO2: 97%  Weight: (!) 317 lb (143.8 kg)  Height:  _0  (1.651 m)   Body mass index is 52.75 kg/m.  Generalized: Well developed, in no acute distress  Cardiology: normal rate and rhythm, no murmur noted Respiratory: clear to auscultation bilaterally  Neurological examination  Mentation: Alert oriented to time, place, history taking. Follows all commands speech and language fluent Cranial nerve II-XII: Pupils were equal round reactive to light. Extraocular movements were full, visual field were full  Motor: The motor testing reveals 5 over 5 strength of all 4 extremities. Good symmetric motor tone is noted throughout.  Gait and station: Gait is normal.    DIAGNOSTIC DATA (LABS, IMAGING, TESTING) - I reviewed patient records, labs, notes, testing and imaging myself where available.  No flowsheet data found.   Lab Results  Component Value Date   WBC 9.7 04/29/2019   HGB 13.9 04/29/2019   HCT 42.7 04/29/2019   MCV 95.0 04/29/2019   PLT 154 04/29/2019      Component Value Date/Time   NA 139 04/28/2019 0331   K 4.5 04/28/2019 0331   CL 112 (H) 04/28/2019 0331   CO2 18 (L) 04/28/2019 0331   GLUCOSE 125 (H) 04/28/2019 0331   BUN 16 04/28/2019 0331   CREATININE 0.90 08/18/2019 0943   CALCIUM 8.5 (L) 04/28/2019 0331   PROT 6.3 (L) 04/28/2019 0331   ALBUMIN 3.5 04/28/2019 0331   AST 58 (H) 04/28/2019 0331   ALT 62 (H) 04/28/2019 0331   ALKPHOS 75 04/28/2019 0331   BILITOT 0.6 04/28/2019 0331   GFRNONAA 54 (L) 04/28/2019 0331   GFRAA >60 04/28/2019 0331   No results found for: CHOL, HDL, LDLCALC, LDLDIRECT, TRIG, CHOLHDL No results found for: HGBA1C No results found for: VITAMINB12 No results found for: TSH  ASSESSMENT AND PLAN 65 y.o. year old female  has a past medical history of Arthritis, Atrial fibrillation ([place]), Dyslipidemia, Family history of brain cancer, Family history of prostate cancer, Family history of stomach cancer, Gastric bypass status for obesity (04/27/2019), GERD (gastroesophageal reflux disease),  History of kidney cancer, History of uterine cancer, adenomatous colonic polyps (2013), Hypertension, Migraines, Morbid obesity ([place]), OSA on CPAP, Renal cancer ([place]) (2018), Single kidney, Sleep apnea, and Uterine cancer ([place]) (2014). here with     ICD-10-CM   1. OSA on CPAP  G47.33    Z99.89         CABRINI RUGGIERI is doing well on CPAP therapy. Compliance report reveals excellent compliance. She was encouraged to continue using CPAP nightly and for greater than 4 hours each night. She will continue to monitor for leak at home. We will update supply orders as indicated. Risks of untreated sleep apnea review and education materials provided. Healthy lifestyle habits encouraged. She will follow up in 1 year, sooner if needed. She verbalizes understanding and agreement with this plan.    No orders of the defined types were placed in this encounter.    No orders of the defined types were placed in this encounter.     Debbora Presto, FNP-C 06/27/2021, 3:27 PM [facility] [address], [place], [place] [address] [phone]

## 2021-07-09 DIAGNOSIS — G4733 Obstructive sleep apnea (adult) (pediatric): Secondary | ICD-10-CM | POA: Diagnosis not present

## 2021-08-09 DIAGNOSIS — G4733 Obstructive sleep apnea (adult) (pediatric): Secondary | ICD-10-CM | POA: Diagnosis not present

## 2021-08-17 DIAGNOSIS — G4733 Obstructive sleep apnea (adult) (pediatric): Secondary | ICD-10-CM | POA: Diagnosis not present

## 2021-08-20 ENCOUNTER — Telehealth: Payer: Self-pay | Admitting: *Deleted

## 2021-08-20 NOTE — Telephone Encounter (Signed)
Not able to reach [facility] @ [phone]

## 2021-09-16 DIAGNOSIS — G4733 Obstructive sleep apnea (adult) (pediatric): Secondary | ICD-10-CM | POA: Diagnosis not present

## 2021-10-17 DIAGNOSIS — G4733 Obstructive sleep apnea (adult) (pediatric): Secondary | ICD-10-CM | POA: Diagnosis not present

## 2021-11-14 ENCOUNTER — Other Ambulatory Visit: Payer: Self-pay | Admitting: Family Medicine

## 2021-11-14 DIAGNOSIS — Z1231 Encounter for screening mammogram for malignant neoplasm of breast: Secondary | ICD-10-CM

## 2021-11-16 ENCOUNTER — Ambulatory Visit
Admission: RE | Admit: 2021-11-16 | Discharge: 2021-11-16 | Disposition: A | Discharge status: Home / Self Care | Payer: BC Managed Care – PPO | Source: Ambulatory Visit | Attending: Family Medicine | Admitting: Family Medicine

## 2021-11-16 DIAGNOSIS — Z1231 Encounter for screening mammogram for malignant neoplasm of breast: Secondary | ICD-10-CM

## 2021-12-19 DIAGNOSIS — Z9884 Bariatric surgery status: Secondary | ICD-10-CM | POA: Diagnosis not present

## 2021-12-19 DIAGNOSIS — I700 Atherosclerosis of aorta: Secondary | ICD-10-CM | POA: Diagnosis not present

## 2021-12-19 DIAGNOSIS — E785 Hyperlipidemia, unspecified: Secondary | ICD-10-CM | POA: Diagnosis not present

## 2021-12-19 DIAGNOSIS — N182 Chronic kidney disease, stage 2 (mild): Secondary | ICD-10-CM | POA: Diagnosis not present

## 2021-12-19 DIAGNOSIS — Z1331 Encounter for screening for depression: Secondary | ICD-10-CM | POA: Diagnosis not present

## 2021-12-19 DIAGNOSIS — I10 Essential (primary) hypertension: Secondary | ICD-10-CM | POA: Diagnosis not present

## 2021-12-29 DIAGNOSIS — L501 Idiopathic urticaria: Secondary | ICD-10-CM | POA: Diagnosis not present

## 2021-12-29 DIAGNOSIS — L299 Pruritus, unspecified: Secondary | ICD-10-CM | POA: Diagnosis not present

## 2022-05-03 IMAGING — MG MM DIGITAL SCREENING BILAT W/ TOMO AND CAD
8 of 17 series · 8 of 40 positions shown · non-contrast
Comparison: Previous exam(s).

CLINICAL DATA: Screening.

EXAM:
DIGITAL SCREENING BILATERAL MAMMOGRAM WITH TOMOSYNTHESIS AND CAD
TECHNIQUE: Bilateral screening digital craniocaudal and mediolateral oblique
mammograms were obtained. Bilateral screening digital breast
tomosynthesis was performed. The images were evaluated with
computer-aided detection.

[L CC synth-2D (1 of 3)]
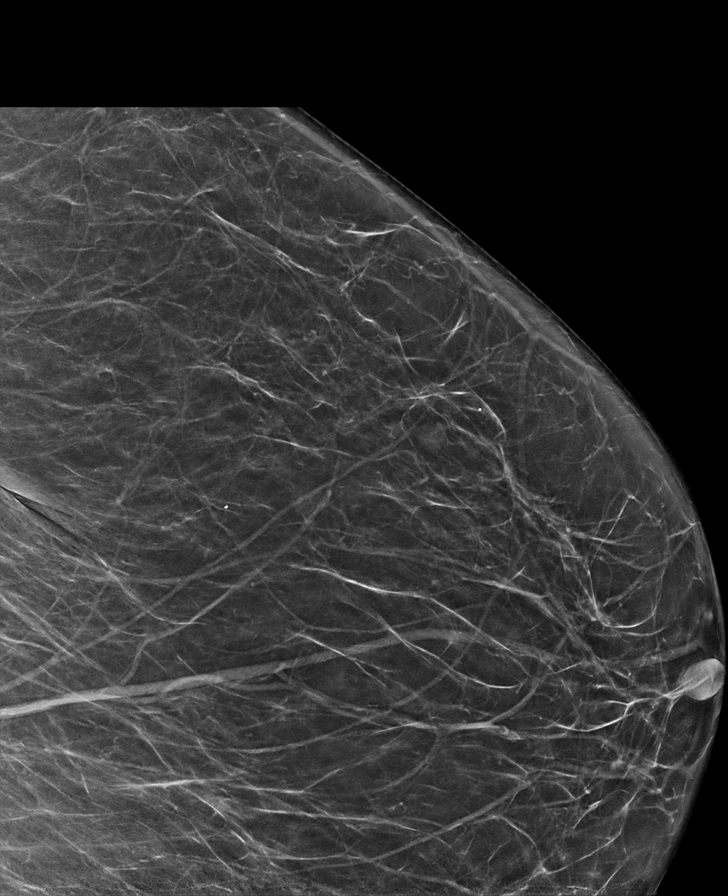

[L CC synth-2D (2 of 3)]
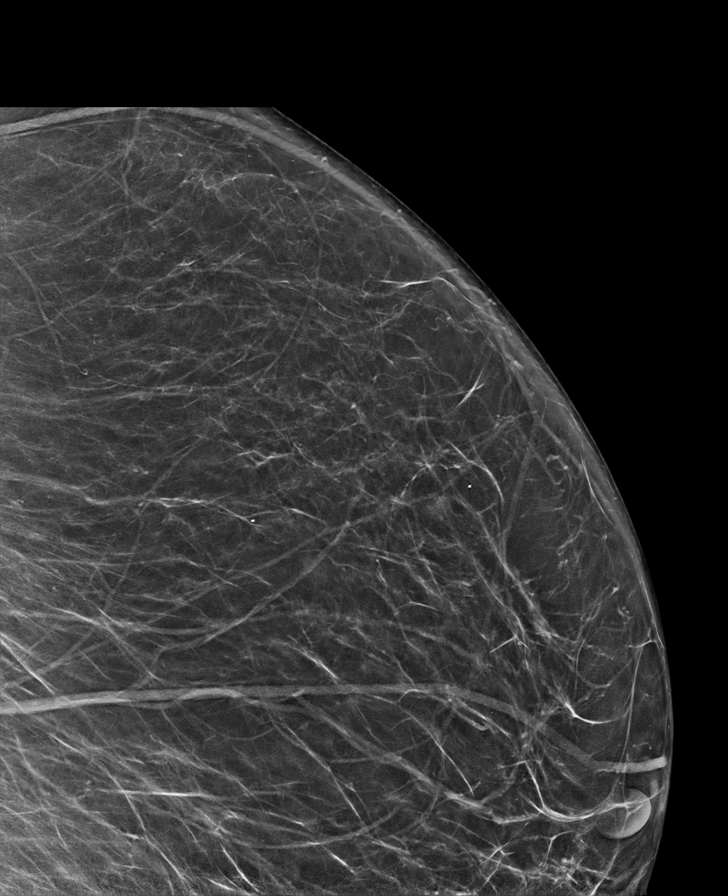

[R CV synth-2D]
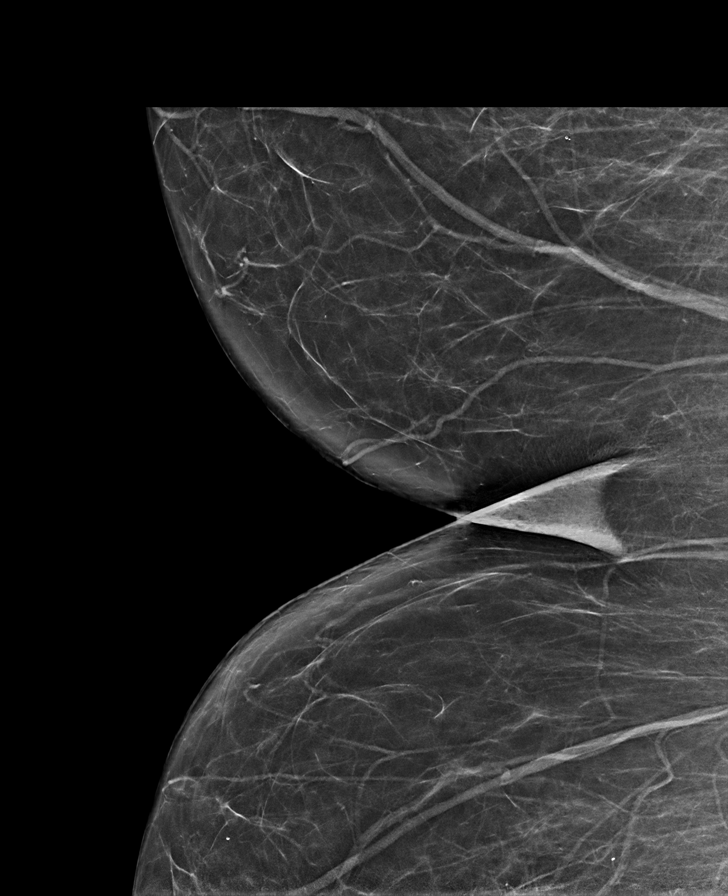

[L MLO synth-2D]
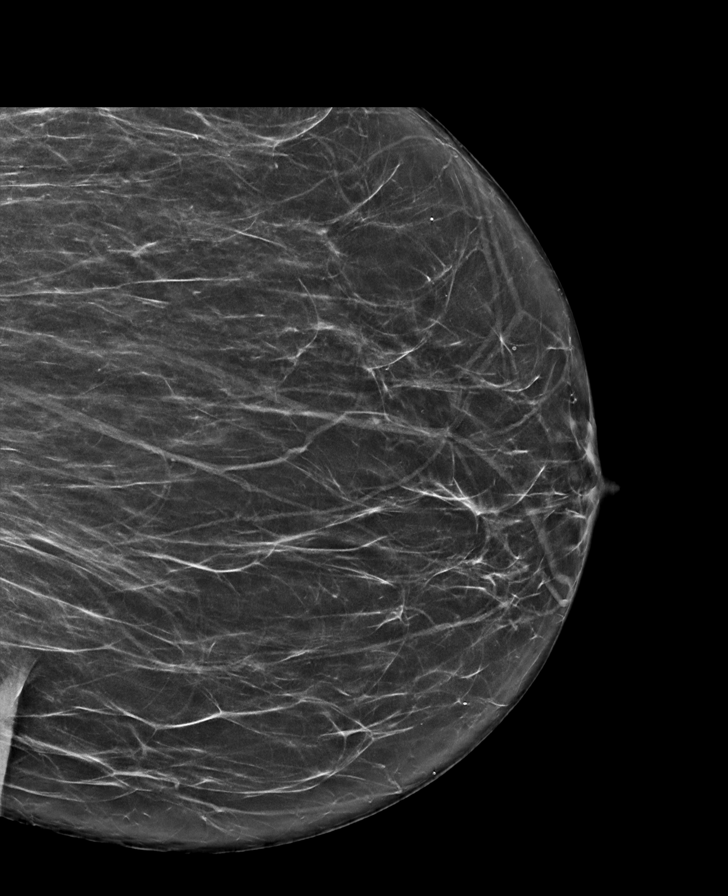

[R MLO synth-2D (1 of 2)]
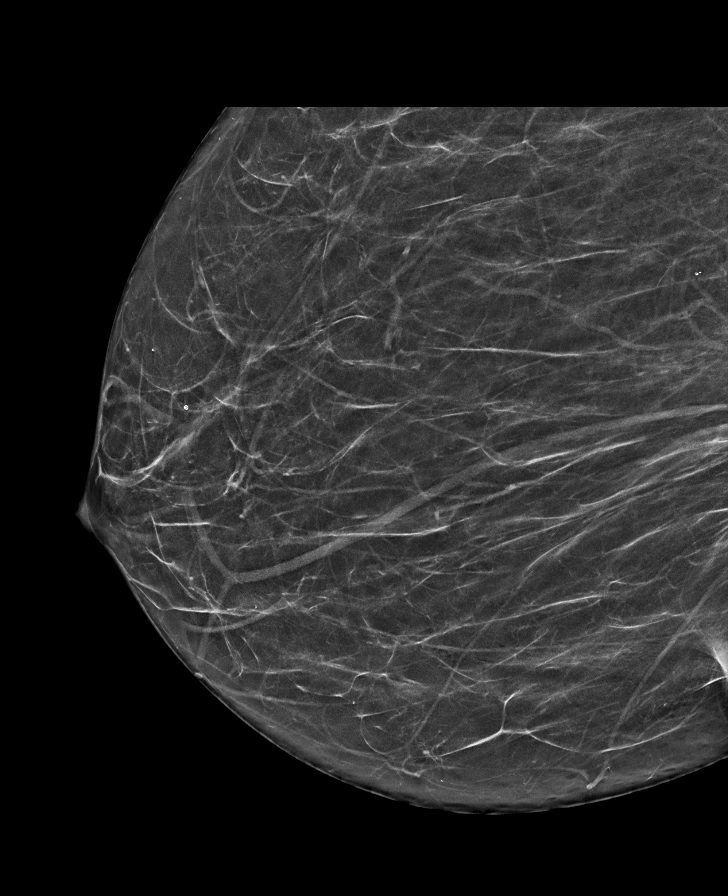

[R MLO synth-2D (2 of 2)]
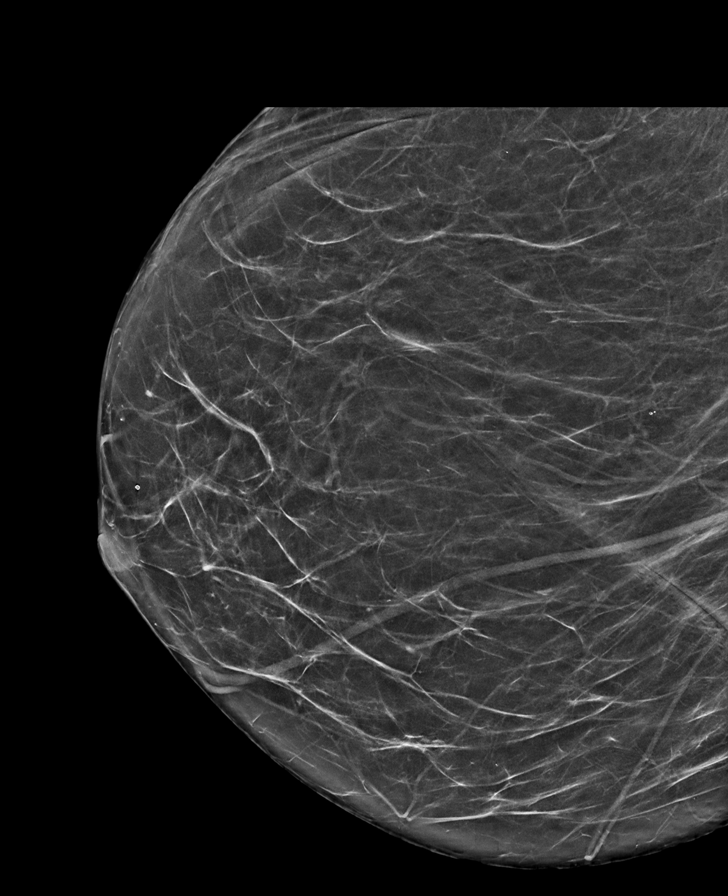

[L CC synth-2D (3 of 3)]
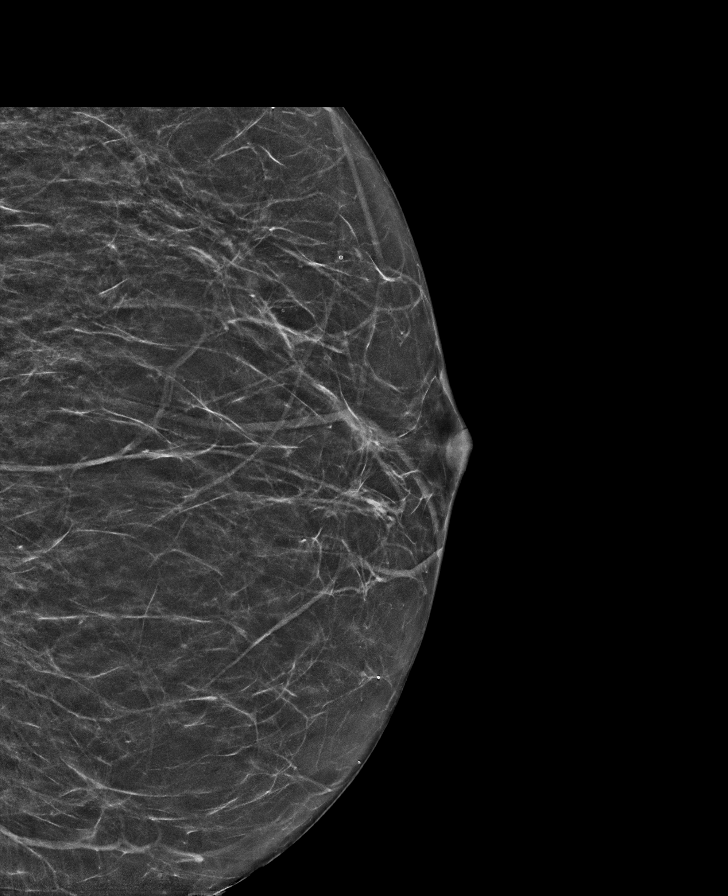

[R CV tomo · tomo slice 37/73.0]
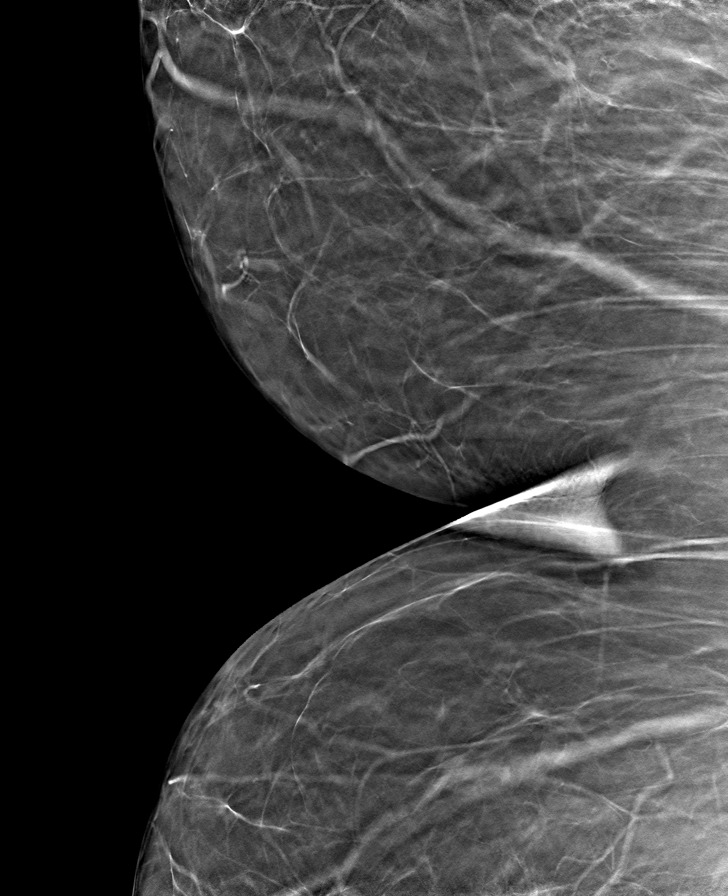

[8 of 40 positions shown; findings below may reference images not displayed]

ACR Breast Density Category b: There are scattered areas of
fibroglandular density.
FINDINGS: There are no findings suspicious for malignancy.
IMPRESSION: No mammographic evidence of malignancy. A result letter of this
screening mammogram will be mailed directly to the patient.

RECOMMENDATION:
Screening mammogram in one year. (Code:51-O-LD2)

BI-RADS CATEGORY  1: Negative.

## 2022-05-08 ENCOUNTER — Other Ambulatory Visit: Payer: Self-pay | Admitting: Adult Health

## 2022-05-08 DIAGNOSIS — C641 Malignant neoplasm of right kidney, except renal pelvis: Secondary | ICD-10-CM

## 2022-05-09 ENCOUNTER — Other Ambulatory Visit: Payer: Self-pay | Admitting: *Deleted

## 2022-05-09 NOTE — Patient Outreach (Signed)
  Care Coordination   Initial Visit Note   05/09/2022 Name: Kelsey Henson MRN: [id number] DOB: 02/12/1956  Kelsey Henson is a 66 y.o. year old female who sees [facility] for [facility] care. I spoke with  Kelsey Henson by phone today  What matters to the patients health and wellness today?  Per patient nothing today. RN discussed services [facility] services, RN, SW, and Pharmacist. Patient declined services.      Goals Addressed   None     SDOH assessments and interventions completed:  No     Care Coordination Interventions Activated:  No  Care Coordination Interventions:  No, not indicated   Follow up plan: No further intervention required.   Encounter Outcome:  Pt. [place] [facility] Management [phone]

## 2022-05-09 NOTE — Patient Outreach (Signed)
  Care Coordination   05/09/2022 Name: Kelsey Henson MRN: [id number] DOB: Apr 10, 1956   Care Coordination Outreach Attempts:  An unsuccessful telephone outreach was attempted today to offer the patient information about available care coordination services as a benefit of their health plan.   Follow Up Plan:  Additional outreach attempts will be made to offer the patient care coordination information and services.   Encounter Outcome:  No Answer  Care Coordination Interventions Activated:  Yes   Care Coordination Interventions:  No, not indicated    [place] Management [phone]

## 2022-06-06 ENCOUNTER — Ambulatory Visit
Admission: RE | Admit: 2022-06-06 | Discharge: 2022-06-06 | Disposition: A | Discharge status: Home / Self Care | Payer: Medicare Other | Source: Ambulatory Visit | Attending: Adult Health | Admitting: Adult Health

## 2022-06-06 DIAGNOSIS — L820 Inflamed seborrheic keratosis: Secondary | ICD-10-CM | POA: Diagnosis not present

## 2022-06-06 DIAGNOSIS — D225 Melanocytic nevi of trunk: Secondary | ICD-10-CM | POA: Diagnosis not present

## 2022-06-06 DIAGNOSIS — L814 Other melanin hyperpigmentation: Secondary | ICD-10-CM | POA: Diagnosis not present

## 2022-06-06 DIAGNOSIS — M47814 Spondylosis without myelopathy or radiculopathy, thoracic region: Secondary | ICD-10-CM | POA: Diagnosis not present

## 2022-06-06 DIAGNOSIS — L821 Other seborrheic keratosis: Secondary | ICD-10-CM | POA: Diagnosis not present

## 2022-06-06 DIAGNOSIS — C641 Malignant neoplasm of right kidney, except renal pelvis: Secondary | ICD-10-CM | POA: Diagnosis not present

## 2022-06-06 DIAGNOSIS — D2239 Melanocytic nevi of other parts of face: Secondary | ICD-10-CM | POA: Diagnosis not present

## 2022-06-06 DIAGNOSIS — M419 Scoliosis, unspecified: Secondary | ICD-10-CM | POA: Diagnosis not present

## 2022-06-06 DIAGNOSIS — I700 Atherosclerosis of aorta: Secondary | ICD-10-CM | POA: Diagnosis not present

## 2022-06-06 MED ORDER — IOPAMIDOL (ISOVUE-300) INJECTION 61%
100.0000 mL | Freq: Once | INTRAVENOUS | Status: AC | PRN
  Administered 2022-06-06: 100 mL via INTRAVENOUS

## 2022-06-10 ENCOUNTER — Telehealth: Payer: Self-pay | Admitting: Family Medicine

## 2022-06-10 ENCOUNTER — Encounter: Payer: Self-pay | Admitting: *Deleted

## 2022-06-10 NOTE — Telephone Encounter (Signed)
..   Pt understands that although there may be some limitations with this type of visit, we will take all precautions to reduce any security or privacy concerns.  Pt understands that this will be treated like an in office visit and we will file with pt's insurance, and there may be a patient responsible charge related to this service. ? ?

## 2022-06-11 ENCOUNTER — Telehealth (INDEPENDENT_AMBULATORY_CARE_PROVIDER_SITE_OTHER): Payer: BC Managed Care – PPO | Admitting: Family Medicine

## 2022-06-11 ENCOUNTER — Encounter: Payer: Self-pay | Admitting: Family Medicine

## 2022-06-11 DIAGNOSIS — Z9989 Dependence on other enabling machines and devices: Secondary | ICD-10-CM

## 2022-06-11 DIAGNOSIS — G4733 Obstructive sleep apnea (adult) (pediatric): Secondary | ICD-10-CM | POA: Diagnosis not present

## 2022-06-11 NOTE — Progress Notes (Signed)
PATIENT: [name] DOB: December 12, 1955  REASON FOR VISIT: follow up HISTORY FROM: patient  Virtual Visit via Telephone Note  I connected with [name] on 06/11/22 at  1:30 PM EDT by telephone and verified that I am speaking with the correct person using two identifiers.   I discussed the limitations, risks, security and privacy concerns of performing an evaluation and management service by telephone and the availability of in person appointments. I also discussed with the patient that there may be a patient responsible charge related to this service. The patient expressed understanding and agreed to proceed.   History of Present Illness:  06/11/22 ALL (mychart): SHATORA WEATHERBEE is a 66 y.o. female here today for follow up for OSA on CPAP. She continues to do well on therapy. She is using her machine every night for at least 8 hours. She is using a nasal pillow. Headgear slips off the back of her head. She is using sleep tape that helps better than chin strap. Rare dry mouth.      Observations/Objective:  Generalized: Well developed, in no acute distress  Mentation: Alert oriented to time, place, history taking. Follows all commands speech and language fluent   Assessment and Plan:  66 y.o. year old female  has a past medical history of Arthritis, Atrial fibrillation ([place]), Dyslipidemia, Family history of brain cancer, Family history of prostate cancer, Family history of stomach cancer, Gastric bypass status for obesity (04/27/2019), GERD (gastroesophageal reflux disease), History of kidney cancer, History of uterine cancer, adenomatous colonic polyps (2013), Hypertension, Migraines, Morbid obesity ([place]), OSA on CPAP, Renal cancer ([place]) (2018), Single kidney, Sleep apnea, and Uterine cancer ([place]) (2014). here with    ICD-10-CM   1. OSA on CPAP  G47.33 For home use only DME continuous positive airway pressure (CPAP)   Z99.89       Jackelyn Poling is doing well on CPAP therapy.  Compliance report reveals excellent compliance. She was encouraged to talk to [facility] regarding headgear to see if there is a better fit. Continue CPAP nightly for at least 4 hours. Supply orders placed. She will follow up in 1 year, sooner if needed.   Orders Placed This Encounter  Procedures   For home use only DME continuous positive airway pressure (CPAP)    Supplies    Order Specific Question:   Length of Need    Answer:   [facility]    Order Specific Question:   Patient has OSA or probable OSA    Answer:   Yes    Order Specific Question:   Is the patient currently using CPAP in the home    Answer:   Yes    Order Specific Question:   Settings    Answer:   Other see comments    Order Specific Question:   CPAP supplies needed    Answer:   Mask, headgear, cushions, filters, heated tubing and water chamber    No orders of the defined types were placed in this encounter.    Follow Up Instructions:  I discussed the assessment and treatment plan with the patient. The patient was provided an opportunity to ask questions and all were answered. The patient agreed with the plan and demonstrated an understanding of the instructions.   The patient was advised to call back or seek an in-person evaluation if the symptoms worsen or if the condition fails to improve as anticipated.  I provided 15 minutes of non-face-to-face time during this encounter. Patient  located at their place of residence during [place] visit. Provider is in the office.    Debbora Presto, NP

## 2022-06-11 NOTE — Patient Instructions (Signed)
Please continue using your CPAP regularly. While your insurance requires that you use CPAP at least 4 hours each night on 70% of the nights, I recommend, that you not skip any nights and use it throughout the night if you can. Getting used to CPAP and staying with the treatment long term does take time and patience and discipline. Untreated obstructive sleep apnea when it is moderate to severe can have an adverse impact on cardiovascular health and raise her risk for heart disease, arrhythmias, hypertension, congestive heart failure, stroke and diabetes. Untreated obstructive sleep apnea causes sleep disruption, nonrestorative sleep, and sleep deprivation. This can have an impact on your day to day functioning and cause daytime sleepiness and impairment of cognitive function, memory loss, mood disturbance, and problems focussing. Using CPAP regularly can improve these symptoms.  Call [facility] to discuss headgear options.   Follow up in 1 year

## 2022-06-13 ENCOUNTER — Telehealth: Payer: BC Managed Care – PPO | Admitting: Family Medicine

## 2022-06-17 DIAGNOSIS — N1831 Chronic kidney disease, stage 3a: Secondary | ICD-10-CM | POA: Diagnosis not present

## 2022-06-17 DIAGNOSIS — Z23 Encounter for immunization: Secondary | ICD-10-CM | POA: Diagnosis not present

## 2022-06-17 DIAGNOSIS — I10 Essential (primary) hypertension: Secondary | ICD-10-CM | POA: Diagnosis not present

## 2022-06-17 DIAGNOSIS — I700 Atherosclerosis of aorta: Secondary | ICD-10-CM | POA: Diagnosis not present

## 2022-06-17 DIAGNOSIS — E785 Hyperlipidemia, unspecified: Secondary | ICD-10-CM | POA: Diagnosis not present

## 2022-06-18 DIAGNOSIS — K429 Umbilical hernia without obstruction or gangrene: Secondary | ICD-10-CM | POA: Diagnosis not present

## 2022-06-18 DIAGNOSIS — C641 Malignant neoplasm of right kidney, except renal pelvis: Secondary | ICD-10-CM | POA: Diagnosis not present

## 2022-06-26 DIAGNOSIS — G4733 Obstructive sleep apnea (adult) (pediatric): Secondary | ICD-10-CM | POA: Diagnosis not present

## 2022-07-26 DIAGNOSIS — G4733 Obstructive sleep apnea (adult) (pediatric): Secondary | ICD-10-CM | POA: Diagnosis not present

## 2022-08-26 DIAGNOSIS — G4733 Obstructive sleep apnea (adult) (pediatric): Secondary | ICD-10-CM | POA: Diagnosis not present

## 2022-08-31 DIAGNOSIS — H5203 Hypermetropia, bilateral: Secondary | ICD-10-CM | POA: Diagnosis not present

## 2022-08-31 DIAGNOSIS — H524 Presbyopia: Secondary | ICD-10-CM | POA: Diagnosis not present

## 2022-09-25 DIAGNOSIS — G4733 Obstructive sleep apnea (adult) (pediatric): Secondary | ICD-10-CM | POA: Diagnosis not present

## 2022-09-25 DIAGNOSIS — J40 Bronchitis, not specified as acute or chronic: Secondary | ICD-10-CM | POA: Diagnosis not present

## 2022-10-04 DIAGNOSIS — J101 Influenza due to other identified influenza virus with other respiratory manifestations: Secondary | ICD-10-CM | POA: Diagnosis not present

## 2022-10-04 DIAGNOSIS — R6889 Other general symptoms and signs: Secondary | ICD-10-CM | POA: Diagnosis not present

## 2022-10-05 DIAGNOSIS — E785 Hyperlipidemia, unspecified: Secondary | ICD-10-CM | POA: Diagnosis not present

## 2022-10-05 DIAGNOSIS — Z6841 Body Mass Index (BMI) 40.0 and over, adult: Secondary | ICD-10-CM | POA: Diagnosis not present

## 2022-10-05 DIAGNOSIS — L509 Urticaria, unspecified: Secondary | ICD-10-CM | POA: Diagnosis not present

## 2022-10-05 DIAGNOSIS — R7401 Elevation of levels of liver transaminase levels: Secondary | ICD-10-CM | POA: Diagnosis not present

## 2022-10-05 DIAGNOSIS — Z9884 Bariatric surgery status: Secondary | ICD-10-CM | POA: Diagnosis not present

## 2022-10-05 DIAGNOSIS — J101 Influenza due to other identified influenza virus with other respiratory manifestations: Secondary | ICD-10-CM | POA: Diagnosis not present

## 2022-10-05 DIAGNOSIS — T782XXA Anaphylactic shock, unspecified, initial encounter: Secondary | ICD-10-CM | POA: Diagnosis not present

## 2022-10-05 DIAGNOSIS — Z885 Allergy status to narcotic agent status: Secondary | ICD-10-CM | POA: Diagnosis not present

## 2022-10-05 DIAGNOSIS — E662 Morbid (severe) obesity with alveolar hypoventilation: Secondary | ICD-10-CM | POA: Diagnosis not present

## 2022-10-05 DIAGNOSIS — I4891 Unspecified atrial fibrillation: Secondary | ICD-10-CM | POA: Diagnosis not present

## 2022-10-06 DIAGNOSIS — T782XXA Anaphylactic shock, unspecified, initial encounter: Secondary | ICD-10-CM | POA: Diagnosis not present

## 2022-11-22 ENCOUNTER — Encounter (HOSPITAL_COMMUNITY): Payer: Self-pay | Admitting: *Deleted

## 2022-12-24 DIAGNOSIS — G4733 Obstructive sleep apnea (adult) (pediatric): Secondary | ICD-10-CM | POA: Diagnosis not present

## 2023-01-24 ENCOUNTER — Other Ambulatory Visit: Payer: Self-pay | Admitting: Family Medicine

## 2023-01-24 DIAGNOSIS — N182 Chronic kidney disease, stage 2 (mild): Secondary | ICD-10-CM | POA: Diagnosis not present

## 2023-01-24 DIAGNOSIS — G4733 Obstructive sleep apnea (adult) (pediatric): Secondary | ICD-10-CM | POA: Diagnosis not present

## 2023-01-24 DIAGNOSIS — Z9884 Bariatric surgery status: Secondary | ICD-10-CM | POA: Diagnosis not present

## 2023-01-24 DIAGNOSIS — E785 Hyperlipidemia, unspecified: Secondary | ICD-10-CM | POA: Diagnosis not present

## 2023-01-24 DIAGNOSIS — I700 Atherosclerosis of aorta: Secondary | ICD-10-CM | POA: Diagnosis not present

## 2023-01-24 DIAGNOSIS — Z1331 Encounter for screening for depression: Secondary | ICD-10-CM | POA: Diagnosis not present

## 2023-01-24 DIAGNOSIS — I10 Essential (primary) hypertension: Secondary | ICD-10-CM | POA: Diagnosis not present

## 2023-01-24 DIAGNOSIS — Z1231 Encounter for screening mammogram for malignant neoplasm of breast: Secondary | ICD-10-CM

## 2023-02-18 ENCOUNTER — Ambulatory Visit
Admission: RE | Admit: 2023-02-18 | Discharge: 2023-02-18 | Disposition: A | Discharge status: Home / Self Care | Payer: BC Managed Care – PPO | Source: Ambulatory Visit | Attending: Family Medicine | Admitting: Family Medicine

## 2023-02-18 DIAGNOSIS — Z1231 Encounter for screening mammogram for malignant neoplasm of breast: Secondary | ICD-10-CM | POA: Insufficient documentation

## 2023-02-23 DIAGNOSIS — G4733 Obstructive sleep apnea (adult) (pediatric): Secondary | ICD-10-CM | POA: Diagnosis not present

## 2023-03-24 DIAGNOSIS — G4733 Obstructive sleep apnea (adult) (pediatric): Secondary | ICD-10-CM | POA: Diagnosis not present

## 2023-03-26 DIAGNOSIS — G4733 Obstructive sleep apnea (adult) (pediatric): Secondary | ICD-10-CM | POA: Diagnosis not present

## 2023-04-23 DIAGNOSIS — G4733 Obstructive sleep apnea (adult) (pediatric): Secondary | ICD-10-CM | POA: Diagnosis not present

## 2023-05-24 DIAGNOSIS — G4733 Obstructive sleep apnea (adult) (pediatric): Secondary | ICD-10-CM | POA: Diagnosis not present

## 2023-06-12 NOTE — Progress Notes (Deleted)
PATIENT: Kelsey Henson DOB: 03-Mar-1956  REASON FOR VISIT: follow up HISTORY FROM: patient  Virtual Visit via Telephone Note  I connected with Kelsey Henson on 06/12/23 at  9:30 AM EDT by telephone and verified that I am speaking with the correct person using two identifiers.   I discussed the limitations, risks, security and privacy concerns of performing an evaluation and management service by telephone and the availability of in person appointments. I also discussed with the patient that there may be a patient responsible charge related to this service. The patient expressed understanding and agreed to proceed.   History of Present Illness:  06/12/23 ALL ([place]): Eunice Blase returns for follow up for OSA on CPAP. She continues to do well on therapy. She is using CPAP nightly for about  She denies concerns with machine or supplies.    06/11/2022 ALL ([place]): YASMEN GALLICK is a 67 y.o. female here today for follow up for OSA on CPAP. She continues to do well on therapy. She is using her machine every night for at least 8 hours. She is using a nasal pillow. Headgear slips off the back of her head. She is using sleep tape that helps better than chin strap. Rare dry mouth.      Observations/Objective:  Generalized: Well developed, in no acute distress  Mentation: Alert oriented to time, place, history taking. Follows all commands speech and language fluent   Assessment and Plan:  67 y.o. year old female  has a past medical history of Arthritis, Atrial fibrillation ([facility]), Dyslipidemia, Family history of brain cancer, Family history of prostate cancer, Family history of stomach cancer, Gastric bypass status for obesity (04/27/2019), GERD (gastroesophageal reflux disease), History of kidney cancer, History of uterine cancer, adenomatous colonic polyps (2013), Hypertension, Migraines, Morbid obesity ([facility]), OSA on CPAP, Renal cancer ([facility]) (2018), Single kidney, Sleep apnea, and  Uterine cancer ([facility]) (2014). here with  No diagnosis found.   Eunice Blase is doing well on CPAP therapy. Compliance report reveals excellent compliance. She was encouraged to talk to [facility] regarding headgear to see if there is a better fit. Continue CPAP nightly for at least 4 hours. Supply orders placed. She will follow up in 1 year, sooner if needed.   No orders of the defined types were placed in this encounter.   No orders of the defined types were placed in this encounter.    Follow Up Instructions:  I discussed the assessment and treatment plan with the patient. The patient was provided an opportunity to ask questions and all were answered. The patient agreed with the plan and demonstrated an understanding of the instructions.   The patient was advised to call back or seek an in-person evaluation if the symptoms worsen or if the condition fails to improve as anticipated.  I provided 15 minutes of non-face-to-face time during this encounter. Patient located at their place of residence during Mychart visit. Provider is in the office.    Shawnie Dapper, NP

## 2023-06-16 ENCOUNTER — Telehealth: Payer: Self-pay

## 2023-06-16 NOTE — Telephone Encounter (Signed)
Please see MyChart message from today 06/16/2023

## 2023-06-17 ENCOUNTER — Telehealth: Payer: BC Managed Care – PPO | Admitting: Family Medicine

## 2023-06-17 DIAGNOSIS — G4733 Obstructive sleep apnea (adult) (pediatric): Secondary | ICD-10-CM

## 2023-06-23 DIAGNOSIS — G4733 Obstructive sleep apnea (adult) (pediatric): Secondary | ICD-10-CM | POA: Diagnosis not present

## 2023-06-26 DIAGNOSIS — G4733 Obstructive sleep apnea (adult) (pediatric): Secondary | ICD-10-CM | POA: Diagnosis not present

## 2023-07-23 DIAGNOSIS — G4733 Obstructive sleep apnea (adult) (pediatric): Secondary | ICD-10-CM | POA: Diagnosis not present

## 2023-08-01 DIAGNOSIS — E785 Hyperlipidemia, unspecified: Secondary | ICD-10-CM | POA: Diagnosis not present

## 2023-08-01 DIAGNOSIS — Z9884 Bariatric surgery status: Secondary | ICD-10-CM | POA: Diagnosis not present

## 2023-08-01 DIAGNOSIS — I700 Atherosclerosis of aorta: Secondary | ICD-10-CM | POA: Diagnosis not present

## 2023-08-01 DIAGNOSIS — N1831 Chronic kidney disease, stage 3a: Secondary | ICD-10-CM | POA: Diagnosis not present

## 2023-08-01 DIAGNOSIS — Z23 Encounter for immunization: Secondary | ICD-10-CM | POA: Diagnosis not present

## 2023-08-01 DIAGNOSIS — I10 Essential (primary) hypertension: Secondary | ICD-10-CM | POA: Diagnosis not present

## 2023-08-06 DIAGNOSIS — M1712 Unilateral primary osteoarthritis, left knee: Secondary | ICD-10-CM | POA: Diagnosis not present

## 2023-08-06 DIAGNOSIS — Z96651 Presence of right artificial knee joint: Secondary | ICD-10-CM | POA: Diagnosis not present

## 2023-08-06 DIAGNOSIS — M722 Plantar fascial fibromatosis: Secondary | ICD-10-CM | POA: Diagnosis not present

## 2023-08-23 DIAGNOSIS — G4733 Obstructive sleep apnea (adult) (pediatric): Secondary | ICD-10-CM | POA: Diagnosis not present

## 2023-09-22 DIAGNOSIS — G4733 Obstructive sleep apnea (adult) (pediatric): Secondary | ICD-10-CM | POA: Diagnosis not present

## 2023-09-25 DIAGNOSIS — G4733 Obstructive sleep apnea (adult) (pediatric): Secondary | ICD-10-CM | POA: Diagnosis not present

## 2023-10-23 DIAGNOSIS — G4733 Obstructive sleep apnea (adult) (pediatric): Secondary | ICD-10-CM | POA: Diagnosis not present

## 2023-11-07 DIAGNOSIS — E785 Hyperlipidemia, unspecified: Secondary | ICD-10-CM | POA: Diagnosis not present

## 2023-11-07 DIAGNOSIS — M25511 Pain in right shoulder: Secondary | ICD-10-CM | POA: Diagnosis not present

## 2023-11-07 DIAGNOSIS — R7989 Other specified abnormal findings of blood chemistry: Secondary | ICD-10-CM | POA: Diagnosis not present

## 2023-11-07 DIAGNOSIS — Z23 Encounter for immunization: Secondary | ICD-10-CM | POA: Diagnosis not present

## 2023-11-23 DIAGNOSIS — G4733 Obstructive sleep apnea (adult) (pediatric): Secondary | ICD-10-CM | POA: Diagnosis not present

## 2023-11-27 ENCOUNTER — Encounter (HOSPITAL_COMMUNITY): Payer: Self-pay | Admitting: *Deleted

## 2023-12-22 DIAGNOSIS — G4733 Obstructive sleep apnea (adult) (pediatric): Secondary | ICD-10-CM | POA: Diagnosis not present

## 2024-01-22 DIAGNOSIS — G4733 Obstructive sleep apnea (adult) (pediatric): Secondary | ICD-10-CM | POA: Diagnosis not present

## 2024-02-21 DIAGNOSIS — G4733 Obstructive sleep apnea (adult) (pediatric): Secondary | ICD-10-CM | POA: Diagnosis not present

## 2024-02-27 DIAGNOSIS — Z1331 Encounter for screening for depression: Secondary | ICD-10-CM | POA: Diagnosis not present

## 2024-04-06 DIAGNOSIS — G4733 Obstructive sleep apnea (adult) (pediatric): Secondary | ICD-10-CM | POA: Diagnosis not present

## 2024-04-09 ENCOUNTER — Other Ambulatory Visit: Payer: Self-pay | Admitting: Family Medicine

## 2024-04-09 DIAGNOSIS — Z1231 Encounter for screening mammogram for malignant neoplasm of breast: Secondary | ICD-10-CM

## 2024-04-29 ENCOUNTER — Ambulatory Visit
Admission: RE | Admit: 2024-04-29 | Discharge: 2024-04-29 | Disposition: A | Discharge status: Home / Self Care | Source: Ambulatory Visit | Attending: Family Medicine | Admitting: Family Medicine

## 2024-04-29 DIAGNOSIS — Z1231 Encounter for screening mammogram for malignant neoplasm of breast: Secondary | ICD-10-CM | POA: Insufficient documentation

## 2024-07-15 DIAGNOSIS — Z9884 Bariatric surgery status: Secondary | ICD-10-CM | POA: Diagnosis not present

## 2024-07-15 DIAGNOSIS — N898 Other specified noninflammatory disorders of vagina: Secondary | ICD-10-CM | POA: Diagnosis not present

## 2024-07-15 DIAGNOSIS — I10 Essential (primary) hypertension: Secondary | ICD-10-CM | POA: Diagnosis not present

## 2024-07-15 DIAGNOSIS — E785 Hyperlipidemia, unspecified: Secondary | ICD-10-CM | POA: Diagnosis not present

## 2024-07-15 DIAGNOSIS — R7989 Other specified abnormal findings of blood chemistry: Secondary | ICD-10-CM | POA: Diagnosis not present

## 2024-07-15 DIAGNOSIS — N182 Chronic kidney disease, stage 2 (mild): Secondary | ICD-10-CM | POA: Diagnosis not present

## 2024-07-15 DIAGNOSIS — Z23 Encounter for immunization: Secondary | ICD-10-CM | POA: Diagnosis not present

## 2024-10-16 ENCOUNTER — Encounter: Payer: Self-pay | Admitting: Internal Medicine
